# Patient Record
Sex: Male | Born: 1970 | Race: White | Hispanic: No | Marital: Single
Health system: Midwestern US, Academic
[De-identification: ages and names within clinical notes are randomized; demographics above are authoritative.]

## PROBLEM LIST (undated history)

## (undated) ENCOUNTER — Emergency Department (HOSPITAL_COMMUNITY): Admission: EM | Discharge status: Against Medical Advice | Payer: Medicare Other

## (undated) DIAGNOSIS — Z21 Asymptomatic human immunodeficiency virus [HIV] infection status: Secondary | ICD-10-CM

## (undated) DIAGNOSIS — F988 Other specified behavioral and emotional disorders with onset usually occurring in childhood and adolescence: Secondary | ICD-10-CM

## (undated) DIAGNOSIS — F4310 Post-traumatic stress disorder, unspecified: Secondary | ICD-10-CM

## (undated) DIAGNOSIS — B20 Human immunodeficiency virus [HIV] disease: Secondary | ICD-10-CM

## (undated) DIAGNOSIS — F319 Bipolar disorder, unspecified: Secondary | ICD-10-CM

## (undated) HISTORY — PX: HERNIA REPAIR: SHX51

---

## 2013-06-25 ENCOUNTER — Encounter (HOSPITAL_COMMUNITY): Payer: Self-pay | Admitting: Emergency Medicine

## 2013-06-25 ENCOUNTER — Emergency Department (HOSPITAL_COMMUNITY)
Admission: EM | Admit: 2013-06-25 | Discharge: 2013-06-25 | Disposition: A | Discharge status: Home / Self Care | Payer: Medicare (Managed Care) | Attending: Emergency Medicine | Admitting: Emergency Medicine

## 2013-06-25 DIAGNOSIS — F319 Bipolar disorder, unspecified: Secondary | ICD-10-CM | POA: Insufficient documentation

## 2013-06-25 DIAGNOSIS — Z21 Asymptomatic human immunodeficiency virus [HIV] infection status: Secondary | ICD-10-CM | POA: Insufficient documentation

## 2013-06-25 DIAGNOSIS — F17200 Nicotine dependence, unspecified, uncomplicated: Secondary | ICD-10-CM | POA: Insufficient documentation

## 2013-06-25 DIAGNOSIS — F4310 Post-traumatic stress disorder, unspecified: Secondary | ICD-10-CM | POA: Insufficient documentation

## 2013-06-25 DIAGNOSIS — R300 Dysuria: Secondary | ICD-10-CM | POA: Insufficient documentation

## 2013-06-25 DIAGNOSIS — Z760 Encounter for issue of repeat prescription: Secondary | ICD-10-CM | POA: Insufficient documentation

## 2013-06-25 HISTORY — DX: Asymptomatic human immunodeficiency virus (hiv) infection status: Z21

## 2013-06-25 HISTORY — DX: Human immunodeficiency virus (HIV) disease: B20

## 2013-06-25 HISTORY — DX: Other specified behavioral and emotional disorders with onset usually occurring in childhood and adolescence: F98.8

## 2013-06-25 HISTORY — DX: Bipolar disorder, unspecified: F31.9

## 2013-06-25 HISTORY — DX: Post-traumatic stress disorder, unspecified: F43.10

## 2013-06-25 NOTE — ED Provider Notes (Signed)
CSN: [id number]     Arrival date & time 06/25/13  0025 History   First MD Initiated Contact with Patient 06/25/13 0116     Chief Complaint  Patient presents with  . Medication Refill     (Consider location/radiation/quality/duration/timing/severity/associated sxs/prior Treatment) HPI HX per PT - has h/o Bipolar and PTSD presents requesting RX refill for ativan 2.5mg , specifically a 7 day supply.  He is unable to say why he only needs 7 day supply.  He does not have any scheduled follow up. He denies any SI/ HI/ hallucinations.  No CP, SOB, ABD pain, HA, weakness or numbness. He denies any specific complaints, states he is new to the area, no local physician.     Past Medical History  Diagnosis Date  . HIV (human immunodeficiency virus infection)   . ADD (attention deficit disorder)   . Bipolar affective   . PTSD (post-traumatic stress disorder)    Past Surgical History  Procedure Laterality Date  . Hernia repair     Family History  Problem Relation Age of Onset  . Seizures Mother    History  Substance Use Topics  . Smoking status: Current Every Day Smoker -- 0.50 packs/day    Types: Cigarettes  . Smokeless tobacco: Not on file  . Alcohol Use: No    Review of Systems  Constitutional: Negative for fever and chills.  Eyes: Negative for visual disturbance.  Respiratory: Negative for shortness of breath.   Cardiovascular: Negative for chest pain.  Gastrointestinal: Negative for vomiting and abdominal pain.  Genitourinary: Positive for dysuria.  Musculoskeletal: Negative for back pain.  Neurological: Negative for headaches.  Psychiatric/Behavioral: The patient is not nervous/anxious.   All other systems reviewed and are negative.      Allergies  Depakote and Haldol  Home Medications  No current outpatient prescriptions on file. BP 129/79  Pulse 88  Temp(Src) 98.7 F (37.1 C) (Oral)  Resp 16  SpO2 100% Physical Exam  Constitutional: He is oriented to person,  place, and time. He appears well-developed and well-nourished.  HENT:  Head: Normocephalic and atraumatic.  Eyes: EOM are normal. Pupils are equal, round, and reactive to light.  Neck: Neck supple.  Cardiovascular: Regular rhythm and intact distal pulses.   Pulmonary/Chest: Effort normal. No respiratory distress.  Musculoskeletal: Normal range of motion. He exhibits no edema.  Neurological: He is alert and oriented to person, place, and time.  Skin: Skin is warm and dry.  Psychiatric: His behavior is normal.  Somewhat bizarre behavior, no overt psychosis. No hallucinations    ED Course  Procedures (including critical care time) Labs Review Labs Reviewed - No data to display Imaging Review No results found.  Plan d/c home with outpatient resources and referrals. PT stable and appropriate for discharge. No ativan provided.   MDM   Dx: requesting medication refill, h/o Bipolar  No SI/ HI. Psychosis or indication for IVC, PSY admit or further evaluation at this time.  VS and nurses notes reviewed and considered.     Sunnie NielsenBrian Lilyian Quayle, MD 06/25/13 0230

## 2013-06-25 NOTE — ED Notes (Signed)
Pt reports that he had to leave and would come back in the morning. Pt insistent on coming back. Encouraged pt to stay and wait for dr. Pt wanted to leave.

## 2013-06-25 NOTE — ED Notes (Signed)
Pt states he is here for medication refill for Ativan 2.5mg  as needed  Pt is requesting a 7 day supply

## 2013-06-25 NOTE — Discharge Instructions (Signed)
Medication Refill, Emergency Department  We have refilled your medication today as a courtesy to you. It is best for your medical care, however, to take care of getting refills done through your primary caregiver's office. They have your records and can do a better job of follow-up than we can in the emergency department.  On maintenance medications, we often only prescribe enough medications to get you by until you are able to see your regular caregiver. This is a more expensive way to refill medications.  In the future, please plan for refills so that you will not have to use the emergency department for this.  Thank you for your help. Your help allows us to better take care of the daily emergencies that enter our department.  Document Released: 06/28/2003 Document Revised: 06/03/2011 Document Reviewed: 03/11/2005  ExitCare® Patient Information ©2014 [employer].

## 2013-06-26 ENCOUNTER — Encounter (HOSPITAL_COMMUNITY): Payer: Self-pay | Admitting: Emergency Medicine

## 2013-06-26 ENCOUNTER — Emergency Department (HOSPITAL_COMMUNITY)
Admission: EM | Admit: 2013-06-26 | Discharge: 2013-06-29 | Disposition: A | Discharge status: Home / Self Care | Payer: 59 | Attending: Emergency Medicine | Admitting: Emergency Medicine

## 2013-06-26 DIAGNOSIS — F911 Conduct disorder, childhood-onset type: Secondary | ICD-10-CM | POA: Insufficient documentation

## 2013-06-26 DIAGNOSIS — F17200 Nicotine dependence, unspecified, uncomplicated: Secondary | ICD-10-CM | POA: Insufficient documentation

## 2013-06-26 DIAGNOSIS — F29 Unspecified psychosis not due to a substance or known physiological condition: Secondary | ICD-10-CM | POA: Diagnosis present

## 2013-06-26 DIAGNOSIS — Z21 Asymptomatic human immunodeficiency virus [HIV] infection status: Secondary | ICD-10-CM | POA: Insufficient documentation

## 2013-06-26 DIAGNOSIS — F22 Delusional disorders: Secondary | ICD-10-CM | POA: Insufficient documentation

## 2013-06-26 LAB — URINALYSIS, ROUTINE W REFLEX MICROSCOPIC
Bilirubin Urine: NEGATIVE
GLUCOSE, UA: NEGATIVE mg/dL
Hgb urine dipstick: NEGATIVE
KETONES UR: NEGATIVE mg/dL
Leukocytes, UA: NEGATIVE
Nitrite: NEGATIVE
PROTEIN: NEGATIVE mg/dL
Specific Gravity, Urine: 1.006 (ref 1.005–1.030)
Urobilinogen, UA: 0.2 mg/dL (ref 0.0–1.0)
pH: 5.5 (ref 5.0–8.0)

## 2013-06-26 LAB — ACETAMINOPHEN LEVEL: Acetaminophen (Tylenol), Serum: <15 ug/mL (ref 10–30)

## 2013-06-26 LAB — COMPREHENSIVE METABOLIC PANEL
ALBUMIN: 4.2 g/dL (ref 3.5–5.2)
ALK PHOS: 79 U/L (ref 39–117)
ALT: 19 U/L (ref 0–53)
AST: 41 U/L — AB (ref 0–37)
BILIRUBIN TOTAL: 0.9 mg/dL (ref 0.3–1.2)
BUN: 20 mg/dL (ref 6–23)
CHLORIDE: 100 meq/L (ref 96–112)
CO2: 24 mEq/L (ref 19–32)
Calcium: 9.6 mg/dL (ref 8.4–10.5)
Creatinine, Ser: 1.08 mg/dL (ref 0.50–1.35)
GFR calc Af Amer: >90 mL/min (ref >90)
GFR calc non Af Amer: 83 mL/min — ABNORMAL LOW (ref >90)
Glucose, Bld: 110 mg/dL — ABNORMAL HIGH (ref 70–99)
POTASSIUM: 3.8 meq/L (ref 3.7–5.3)
SODIUM: 136 meq/L — AB (ref 137–147)
Total Protein: 8.1 g/dL (ref 6.0–8.3)

## 2013-06-26 LAB — CBC WITH DIFFERENTIAL/PLATELET
Basophils Absolute: 0 10*3/uL (ref 0.0–0.1)
Basophils Relative: 0 % (ref 0–1)
EOS ABS: 0 10*3/uL (ref 0.0–0.7)
Eosinophils Relative: 0 % (ref 0–5)
HCT: 43 % (ref 39.0–52.0)
HEMOGLOBIN: 14.8 g/dL (ref 13.0–17.0)
Lymphocytes Relative: 37 % (ref 12–46)
Lymphs Abs: 2.4 10*3/uL (ref 0.7–4.0)
MCH: 31.2 pg (ref 26.0–34.0)
MCHC: 34.4 g/dL (ref 30.0–36.0)
MCV: 90.7 fL (ref 78.0–100.0)
Monocytes Absolute: 0.7 10*3/uL (ref 0.1–1.0)
Monocytes Relative: 11 % (ref 3–12)
NEUTROS ABS: 3.4 10*3/uL (ref 1.7–7.7)
NEUTROS PCT: 52 % (ref 43–77)
PLATELETS: 155 10*3/uL (ref 150–400)
RBC: 4.74 MIL/uL (ref 4.22–5.81)
RDW: 13.1 % (ref 11.5–15.5)
WBC: 6.4 10*3/uL (ref 4.0–10.5)

## 2013-06-26 LAB — SALICYLATE LEVEL

## 2013-06-26 LAB — RAPID URINE DRUG SCREEN, HOSP PERFORMED
AMPHETAMINES: NOT DETECTED
Barbiturates: NOT DETECTED
Benzodiazepines: NOT DETECTED
Cocaine: NOT DETECTED
OPIATES: NOT DETECTED
Tetrahydrocannabinol: NOT DETECTED

## 2013-06-26 LAB — ETHANOL

## 2013-06-26 MED ORDER — ZIPRASIDONE MESYLATE 20 MG IM SOLR
20.0000 mg | Freq: Once | INTRAMUSCULAR | Status: AC
  Administered 2013-06-26: 20 mg via INTRAMUSCULAR

## 2013-06-26 MED ORDER — ZIPRASIDONE MESYLATE 20 MG IM SOLR
INTRAMUSCULAR | Status: AC
  Filled 2013-06-26: qty 20

## 2013-06-26 MED ORDER — ZOLPIDEM TARTRATE 5 MG PO TABS
5.0000 mg | ORAL_TABLET | Freq: Every evening | ORAL | Status: DC | PRN
  Administered 2013-06-26: 5 mg via ORAL
  Filled 2013-06-26: qty 1

## 2013-06-26 MED ORDER — LORAZEPAM 1 MG PO TABS
1.0000 mg | ORAL_TABLET | Freq: Four times a day (QID) | ORAL | Status: DC | PRN
Start: 2013-06-26 — End: 2013-06-29
  Administered 2013-06-26 – 2013-06-27 (×2): 1 mg via ORAL
  Filled 2013-06-26 (×2): qty 1

## 2013-06-26 MED ORDER — NICOTINE 21 MG/24HR TD PT24
21.0000 mg | MEDICATED_PATCH | Freq: Every day | TRANSDERMAL | Status: DC
  Administered 2013-06-27 – 2013-06-29 (×4): 21 mg via TRANSDERMAL
  Filled 2013-06-26 (×4): qty 1

## 2013-06-26 NOTE — ED Notes (Signed)
Pt eventually returned to his room.

## 2013-06-26 NOTE — ED Notes (Addendum)
On arrival to unit is requesting something to eat. Offered sandwich, soda, graham crackers. Asked for 2 sandwiches, both given. Pleasant and cooperative. States he has not slept in 4 days and would like something to help him sleep.
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
## 2013-06-26 NOTE — ED Notes (Signed)
Pt asleep; no s/s of distress noted. Respirations regular and unlabored.

## 2013-06-26 NOTE — ED Notes (Signed)
Patient awake, agitated and walking to exit doorway down the hallway and appears confused. RN asked pt where he was going, pt began to yell and curse at this RN "Where the hell you going? You don't ask me where I'm going woman! Don't talk to me that way, you don't tell me where the damned bathroom is!" Pt agitated and continues to curse loudly and is angry. [employer] and security in room to redirect behaviors.

## 2013-06-26 NOTE — ED Notes (Signed)
Pt up to window mocking staff and making obscene gestures. 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

## 2013-06-26 NOTE — ED Notes (Signed)
Pt. manipulative and condescending towards staff, insulting them and mocking accents. Pt restless, with loose association, tangential at times. Pt redirected, meds given for agitation and sleep.

## 2013-06-26 NOTE — ED Provider Notes (Signed)
CSN: [id number]     Arrival date & time 06/26/13  1443 History  This chart was scribed for non-physician practitioner, Teressa LowerVrinda Earla Charlie, FNP,working with No att. providers found, by Karle PlumberJennifer Tensley, ED Scribe.  This patient was seen in room WTR2/WLPT2 and the patient's care was started at 3:04 PM.  Chief Complaint  Patient presents with  . Medical Clearance   The history is provided by the patient and the police. No language interpreter was used.   Level 5 Caveat- Full history could not be obtained due to pt being psychotic. HPI Comments:  Adam [name] is a 43 y.o. male with h/o PTSD, HIV, and bipolar disorder [facility], who presents to the Emergency Department secondary to being aggressive towards [place] and security [place] [address].   Past Medical History  Diagnosis Date  . HIV (human immunodeficiency virus infection)   . ADD (attention deficit disorder)   . Bipolar affective   . PTSD (post-traumatic stress disorder)    Past Surgical History  Procedure Laterality Date  . Hernia repair     Family History  Problem Relation Age of Onset  . Seizures Mother    History  Substance Use Topics  . Smoking status: Current Every Day Smoker -- 0.50 packs/day    Types: Cigarettes  . Smokeless tobacco: Not on file  . Alcohol Use: No    Review of Systems  Level 5 Caveat- Full history could not be obtained due to pt being psychotic.  Allergies  Depakote and Haldol  Home Medications  No current outpatient prescriptions on file. Triage Vitals: BP 125/82  Pulse 93  Temp(Src) 98.4 F (36.9 C) (Oral)  Resp 16  Ht 6\' 3"  (1.905 m)  Wt 170 lb (77.111 kg)  BMI 21.25 kg/m2  SpO2 97% Physical Exam  Nursing note and vitals reviewed. Constitutional: He is oriented to person, place, and time. He appears well-developed and well-nourished.  HENT:  Head: Normocephalic and atraumatic.  Eyes: EOM are normal.  Neck: Normal range of motion.  Cardiovascular: Normal rate,  regular rhythm and normal heart sounds.  Exam reveals no gallop and no friction rub.   No murmur heard. Pulmonary/Chest: Effort normal and breath sounds normal. No respiratory distress. He has no wheezes. He has no rales. He exhibits no tenderness.  Musculoskeletal: Normal range of motion.  Neurological: He is alert and oriented to person, place, and time.  Skin: Skin is warm and dry.  Psychiatric: He has a normal mood and affect. His speech is rapid and/or pressured and tangential. He is hyperactive. Thought content is delusional.    ED Course  Procedures (including critical care time) DIAGNOSTIC STUDIES: Oxygen Saturation is 97% on RA, normal by my interpretation.   COORDINATION OF CARE: 3:07 PM- Will order standard medical clearance labs. Pt verbalizes understanding and agrees to plan.  Medications  ziprasidone (GEODON) 20 MG injection (not administered)  ziprasidone (GEODON) injection 20 mg (not administered)    Labs Review Labs Reviewed  CBC WITH DIFFERENTIAL  COMPREHENSIVE METABOLIC PANEL  URINALYSIS, ROUTINE W REFLEX MICROSCOPIC  ETHANOL  ACETAMINOPHEN LEVEL  SALICYLATE LEVEL   Imaging Review No results found.   EKG Interpretation None      MDM   Final diagnoses:  Psychosis    Pt was evaluated by Dr. Fonnie JarvisBednar as well. Pt is to go to pysch ed and be evaluated by tts  I personally performed the services described in this documentation, which was scribed in my presence. The recorded information  has been reviewed and is accurate.    Teressa Lower, NP 06/26/13 1737
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
## 2013-06-26 NOTE — Progress Notes (Signed)
Writer informed the nurse Marcelino DusterMichelle that the patient is under review at the following facilities: [facility], [facility], [address], [facility], [facility], [facility], [facility], [facility], [facility].

## 2013-06-26 NOTE — ED Notes (Signed)
Pt sister contact information: [phone]

## 2013-06-26 NOTE — Progress Notes (Signed)
Adam [name] gave permission to talk with his mother, Adam [name] at [phone].  His sister is also listed:  Adam [name] at [phone] but permission not granted.  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  [place] moved to [place] three weeks ago and [facility].  She would like to be contacted prior to his discharge or transfer.   Nanine MeansJamison Reham Slabaugh, PMH-NP

## 2013-06-26 NOTE — BH Assessment (Signed)
[facility] called to report Pt has been declined due to aggression.  Harlin RainFord Ellis Ria CommentWarrick Jr, LPC, [facility] Triage Specialist [phone]

## 2013-06-26 NOTE — Progress Notes (Signed)
Pt's referral was faxed to the following facilities with bed availability for inptx: [facility]- per [facility] beds available [facility]- per [facility] beds available [facility]- per [facility] beds available [facility]- [facility]- per Tom beds available [facility]- per [facility] beds available [facility]- per Trey [facility] beds available [facility]- per Christiane Ha[facility] beds available [facility]- per [facility] available  The following facilities contacted are at capacity: [facility]- per [facility]- per [facility]- per Dorathy DaftKayla on gero beds available [facility]- per [facility] -per [facility] She[facility] possible gero only [facility]- per [facility]- per [facility]- per [facility], [place] stated Dr. [facility] not taking anymore outside referrals until tomorrow (Sun 4.5) after 11am [facility]- per Tacey RuizLeah at capacity on all levels except for child male bed    [facility] Britnee Mcdevitt Disposition MHT
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
## 2013-06-26 NOTE — Progress Notes (Signed)
[facility] in [facility] as well as if any PRN medications had been given.  Information was looked up and given.  In addition wanted information on wether pt would be IVC'd or not, informed them that EDP was in the process of doing IVC paper work at this time.   Tomi BambergerMariya Lundy Cozart Disposition MHT

## 2013-06-26 NOTE — ED Notes (Signed)
Per [employer], Patient gave them phone number of sister. Bernell ListSonya Burgess [phone]

## 2013-06-26 NOTE — ED Notes (Signed)
Up to restroom.

## 2013-06-26 NOTE — Consult Note (Signed)
[facility] Face-to-Face Psychiatry Consult   Reason for Consult:  Psychosis Referring Physician:  ED MD Johnel Yielding is an 43 y.o. male. Total Time spent with patient: 20 minutes  Assessment: AXIS I:  Psychotic Disorder NOS AXIS II:  Deferred AXIS III:   Past Medical History  Diagnosis Date  . HIV (human immunodeficiency virus infection)   . ADD (attention deficit disorder)   . Bipolar affective   . PTSD (post-traumatic stress disorder)    AXIS IV:  economic problems, housing problems, other psychosocial or environmental problems, problems related to social environment and problems with primary support group AXIS V:  21-30 behavior considerably influenced by delusions or hallucinations OR serious impairment in judgment, communication OR inability to function in almost all areas  Plan:  Recommend psychiatric Inpatient admission when medically cleared.  Subjective:   Zacory Fiola is a 43 y.o. male patient admitted with psychosis.  HPI:   Patient came to the ED yesterday requesting Ativan but was denied.  Today, the police picked him up at the mall when he was causing a disturbance.  [place].  Dishelved, malodorous, guarded, paranoid, forwards little to no information, "It is none of your business."    HPI Elements:   Location:  generalized. Quality:  acute. Severity:  severe. Timing:  constant. Duration:  few days. Context:  stressors.  Past Psychiatric History: Past Medical History  Diagnosis Date  . HIV (human immunodeficiency virus infection)   . ADD (attention deficit disorder)   . Bipolar affective   . PTSD (post-traumatic stress disorder)     reports that he has been smoking Cigarettes.  He has been smoking about 0.50 packs per day. He does not have any smokeless tobacco history on file. He reports that he does not drink alcohol or use illicit drugs. Family History  Problem Relation Age of Onset  . Seizures Mother            Allergies:    Allergies  Allergen Reactions  . Depakote [Divalproex Sodium] Anaphylaxis and Other (See Comments)    Pt states one of these meds attacked his white blood cells  . Haldol [Haloperidol Lactate] Anaphylaxis and Other (See Comments)    Pt states one of these meds attacked his white blood cells    ACT Assessment Complete:  Yes:    Educational Status    Risk to Self: Risk to self Is patient at risk for suicide?: No Substance abuse history and/or treatment for substance abuse?: No  Risk to Others:    Abuse:    Prior Inpatient Therapy:    Prior Outpatient Therapy:    Additional Information:                    Objective: Blood pressure 125/82, pulse 93, temperature 98.4 F (36.9 C), temperature source Oral, resp. rate 16, height _0  (1.905 m), weight 170 lb (77.111 kg), SpO2 97.00%.Body mass index is 21.25 kg/(m^2). Results for orders placed during the hospital encounter of 06/26/13 (from the past 72 hour(s))  CBC WITH DIFFERENTIAL     Status: None   Collection Time    06/26/13  3:40 PM      Result Value Ref Range   WBC 6.4  4.0 - 10.5 K/uL   RBC 4.74  4.22 - 5.81 MIL/uL   Hemoglobin 14.8  13.0 - 17.0 g/dL   HCT 43.0  39.0 - 52.0 %   MCV 90.7  78.0 - 100.0 fL  MCH 31.2  26.0 - 34.0 pg   MCHC 34.4  30.0 - 36.0 g/dL   RDW 13.1  11.5 - 15.5 %   Platelets 155  150 - 400 K/uL   Neutrophils Relative % 52  43 - 77 %   Neutro Abs 3.4  1.7 - 7.7 K/uL   Lymphocytes Relative 37  12 - 46 %   Lymphs Abs 2.4  0.7 - 4.0 K/uL   Monocytes Relative 11  3 - 12 %   Monocytes Absolute 0.7  0.1 - 1.0 K/uL   Eosinophils Relative 0  0 - 5 %   Eosinophils Absolute 0.0  0.0 - 0.7 K/uL   Basophils Relative 0  0 - 1 %   Basophils Absolute 0.0  0.0 - 0.1 K/uL  COMPREHENSIVE METABOLIC PANEL     Status: Abnormal   Collection Time    06/26/13  3:40 PM      Result Value Ref Range   Sodium 136 (*) 137 - 147 mEq/L   Potassium 3.8  3.7 - 5.3 mEq/L   Chloride 100  96 - 112 mEq/L   CO2 24   19 - 32 mEq/L   Glucose, Bld 110 (*) 70 - 99 mg/dL   BUN 20  6 - 23 mg/dL   Creatinine, Ser 1.08  0.50 - 1.35 mg/dL   Calcium 9.6  8.4 - 10.5 mg/dL   Total Protein 8.1  6.0 - 8.3 g/dL   Albumin 4.2  3.5 - 5.2 g/dL   AST 41 (*) 0 - 37 U/L   ALT 19  0 - 53 U/L   Alkaline Phosphatase 79  39 - 117 U/L   Total Bilirubin 0.9  0.3 - 1.2 mg/dL   GFR calc non Af Amer 83 (*) >90 mL/min   GFR calc Af Amer >90  >90 mL/min   Comment: (NOTE)     The eGFR has been calculated using the CKD EPI equation.     This calculation has not been validated in all clinical situations.     eGFR's persistently <90 mL/min signify possible Chronic Kidney     Disease.  ETHANOL     Status: None   Collection Time    06/26/13  3:40 PM      Result Value Ref Range   Alcohol, Ethyl (B) <11  0 - 11 mg/dL   Comment:            LOWEST DETECTABLE LIMIT FOR     SERUM ALCOHOL IS 11 mg/dL     FOR MEDICAL PURPOSES ONLY  ACETAMINOPHEN LEVEL     Status: None   Collection Time    06/26/13  3:40 PM      Result Value Ref Range   Acetaminophen (Tylenol), Serum <15.0  10 - 30 ug/mL   Comment:            THERAPEUTIC CONCENTRATIONS VARY     SIGNIFICANTLY. A RANGE OF 10-30     ug/mL MAY BE AN EFFECTIVE     CONCENTRATION FOR MANY PATIENTS.     HOWEVER, SOME ARE BEST TREATED     AT CONCENTRATIONS OUTSIDE THIS     RANGE.     ACETAMINOPHEN CONCENTRATIONS     >150 ug/mL AT 4 HOURS AFTER     INGESTION AND >50 ug/mL AT 12     HOURS AFTER INGESTION ARE     OFTEN ASSOCIATED WITH TOXIC     REACTIONS.  SALICYLATE LEVEL  Status: Abnormal   Collection Time    06/26/13  3:40 PM      Result Value Ref Range   Salicylate Lvl <3.8 (*) 2.8 - 20.0 mg/dL   Labs are reviewed and are pertinent for medical issues being treated.  Current Facility-Administered Medications  Medication Dose Route Frequency Provider Last Rate Last Dose  . nicotine (NICODERM CQ - dosed in mg/24 hours) patch 21 mg  21 mg Transdermal Daily Babette Relic, MD        No current outpatient prescriptions on file.    Psychiatric Specialty Exam:     Blood pressure 125/82, pulse 93, temperature 98.4 F (36.9 C), temperature source Oral, resp. rate 16, height _0  (1.905 m), weight 170 lb (77.111 kg), SpO2 97.00%.Body mass index is 21.25 kg/(m^2).  General Appearance: Disheveled  Eye Contact::  Minimal  Speech:  Normal Rate  Volume:  Normal  Mood:  Angry and Irritable  Affect:  Flat  Thought Process:  Irrelevant  Orientation:  Full (Time, Place, and Person)  Thought Content:  Hallucinations: Auditory Visual and Paranoid Ideation  Suicidal Thoughts:  No  Homicidal Thoughts:  No  Memory:  Immediate;   Fair Recent;   Fair Remote;   Poor  Judgement:  Impaired  Insight:  Lacking  Psychomotor Activity:  Normal  Concentration:  Fair  Recall:  Poor  Fund of Knowledge:Fair  Language: Good  Akathisia:  No  Handed:  Right  AIMS (if indicated):     Assets:  Resilience  Sleep:      Musculoskeletal: Strength & Muscle Tone: within normal limits Gait & Station: normal Patient leans: N/A  Treatment Plan Summary: Antipsychotic medications and awaiting labs.  Waylan Boga, [place] 06/26/2013 4:35 PM  Reviewed the information documented and agree with the treatment plan.  Anyelo Mccue,JANARDHAHA R. 06/27/2013 9:34 AM

## 2013-06-27 ENCOUNTER — Encounter (HOSPITAL_COMMUNITY): Payer: Self-pay | Admitting: Registered Nurse

## 2013-06-27 MED ORDER — DIPHENHYDRAMINE HCL 25 MG PO CAPS
50.0000 mg | ORAL_CAPSULE | Freq: Two times a day (BID) | ORAL | Status: DC
  Administered 2013-06-27 – 2013-06-29 (×4): 50 mg via ORAL
  Filled 2013-06-27 (×4): qty 2

## 2013-06-27 MED ORDER — LAMOTRIGINE 100 MG PO TABS
100.0000 mg | ORAL_TABLET | Freq: Two times a day (BID) | ORAL | Status: DC
  Administered 2013-06-27 – 2013-06-29 (×4): 100 mg via ORAL
  Filled 2013-06-27 (×6): qty 1

## 2013-06-27 MED ORDER — DIPHENHYDRAMINE HCL 50 MG/ML IJ SOLN
50.0000 mg | Freq: Once | INTRAMUSCULAR | Status: AC
  Administered 2013-06-27: 50 mg via INTRAMUSCULAR

## 2013-06-27 MED ORDER — LIDOCAINE HCL (CARDIAC) 20 MG/ML IV SOLN
INTRAVENOUS | Status: AC
  Filled 2013-06-27: qty 5

## 2013-06-27 MED ORDER — DIPHENHYDRAMINE HCL 50 MG/ML IJ SOLN
INTRAMUSCULAR | Status: AC
  Administered 2013-06-27: 50 mg via INTRAMUSCULAR
  Filled 2013-06-27: qty 1

## 2013-06-27 MED ORDER — ZIPRASIDONE HCL 20 MG PO CAPS
40.0000 mg | ORAL_CAPSULE | Freq: Two times a day (BID) | ORAL | Status: DC
  Administered 2013-06-27 – 2013-06-28 (×3): 40 mg via ORAL
  Filled 2013-06-27 (×3): qty 2

## 2013-06-27 MED ORDER — ZIPRASIDONE MESYLATE 20 MG IM SOLR
20.0000 mg | Freq: Once | INTRAMUSCULAR | Status: AC
  Administered 2013-06-27: 20 mg via INTRAMUSCULAR

## 2013-06-27 MED ORDER — SUCCINYLCHOLINE CHLORIDE 20 MG/ML IJ SOLN
INTRAMUSCULAR | Status: AC
  Filled 2013-06-27: qty 1

## 2013-06-27 MED ORDER — LORAZEPAM 1 MG PO TABS
2.0000 mg | ORAL_TABLET | Freq: Two times a day (BID) | ORAL | Status: DC
  Administered 2013-06-27 – 2013-06-29 (×4): 2 mg via ORAL
  Filled 2013-06-27 (×4): qty 2

## 2013-06-27 MED ORDER — LORAZEPAM 2 MG/ML IJ SOLN
2.0000 mg | Freq: Once | INTRAMUSCULAR | Status: AC
  Administered 2013-06-27: 2 mg via INTRAMUSCULAR

## 2013-06-27 MED ORDER — ZIPRASIDONE MESYLATE 20 MG IM SOLR: 20.0000 mg | Freq: Once | INTRAMUSCULAR | Status: DC

## 2013-06-27 MED ORDER — LORAZEPAM 2 MG/ML IJ SOLN
INTRAMUSCULAR | Status: AC
  Administered 2013-06-27: 2 mg via INTRAMUSCULAR
  Filled 2013-06-27: qty 1

## 2013-06-27 MED ORDER — ROCURONIUM BROMIDE 50 MG/5ML IV SOLN
INTRAVENOUS | Status: AC
  Filled 2013-06-27: qty 2

## 2013-06-27 MED ORDER — ETOMIDATE 2 MG/ML IV SOLN
INTRAVENOUS | Status: AC
  Filled 2013-06-27: qty 20

## 2013-06-27 MED ORDER — ZIPRASIDONE MESYLATE 20 MG IM SOLR
INTRAMUSCULAR | Status: AC
Start: 2013-06-27 — End: 2013-06-27
  Administered 2013-06-27: 20 mg via INTRAMUSCULAR
  Filled 2013-06-27: qty 20
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
## 2013-06-27 NOTE — ED Notes (Signed)
Pt up out of bed, urinating on the floor as he walked to the bathroom. Pt unsteady and needing one person assist.

## 2013-06-27 NOTE — Progress Notes (Signed)
[facility], states pt has been declined d/t aggressive behavior.   Tomi BambergerMariya Dayvian Blixt Disposition MHT

## 2013-06-27 NOTE — Consult Note (Signed)
[facility] Follow UP Psychiatry Consult   Reason for Consult:  Psychosis Referring Physician:  ED MD Adam [name] is an 43 y.o. male. Total Time spent with patient: 20 minutes  Assessment: AXIS I:  Psychotic Disorder NOS AXIS II:  Deferred AXIS III:   Past Medical History  Diagnosis Date  . HIV (human immunodeficiency virus infection)   . ADD (attention deficit disorder)   . Bipolar affective   . PTSD (post-traumatic stress disorder)    AXIS IV:  economic problems, housing problems, other psychosocial or environmental problems, problems related to social environment and problems with primary support group AXIS V:  21-30 behavior considerably influenced by delusions or hallucinations OR serious impairment in judgment, communication OR inability to function in almost all areas  Plan:  Recommend psychiatric Inpatient admission when medically cleared.  Subjective:   Adam [name] is a 43 y.o. male patient admitted with psychosis.  HPI:   Patient states that he is on vacation. "I'm running out of time; I've got a plan to catch in 3 days.  Look all you need to do is Listen, write and repeat."  Patient couldn't give a detail history of psych history.  States that he does not like and will not take Lithium, Depakote, Risperdal Consta."  Patient states that he is from [facility] and is here just visiting. Patient also reported he was previously treated with Geodon, risperidone, and Lamictal and Ativan. Patient stated he does not like needles and denied using drugs. Patient has odd, bizarre behaviors, manic and grandiose delusions and disorganized thoughts.  HPI Elements:   Location:  generalized. Quality:  acute. Severity:  severe. Timing:  constant. Duration:  few days. Context:  stressors. Review of Systems  Musculoskeletal: Negative.   Psychiatric/Behavioral: Positive for hallucinations. Negative for depression and suicidal ideas. The patient is nervous/anxious.     Past Psychiatric  History: Past Medical History  Diagnosis Date  . HIV (human immunodeficiency virus infection)   . ADD (attention deficit disorder)   . Bipolar affective   . PTSD (post-traumatic stress disorder)     reports that he has been smoking Cigarettes.  He has been smoking about 0.50 packs per day. He does not have any smokeless tobacco history on file. He reports that he does not drink alcohol or use illicit drugs. Family History  Problem Relation Age of Onset  . Seizures Mother            Allergies:   Allergies  Allergen Reactions  . Depakote [Divalproex Sodium] Anaphylaxis and Other (See Comments)    Pt states one of these meds attacked his white blood cells  . Haldol [Haloperidol Lactate] Anaphylaxis and Other (See Comments)    Pt states one of these meds attacked his white blood cells    ACT Assessment Complete:  Yes:    Educational Status    Risk to Self: Risk to self Is patient at risk for suicide?: No Substance abuse history and/or treatment for substance abuse?: No  Risk to Others:    Abuse:    Prior Inpatient Therapy:    Prior Outpatient Therapy:    Additional Information:     Objective: Blood pressure 130/90, pulse 88, temperature 97.5 F (36.4 C), temperature source Oral, resp. rate 20, height 6' 3"  (1.905 m), weight 77.111 kg (170 lb), SpO2 97.00%.Body mass index is 21.25 kg/(m^2). Results for orders placed during the hospital encounter of 06/26/13 (from the past 72 hour(s))  CBC WITH DIFFERENTIAL     Status:  None   Collection Time    06/26/13  3:40 PM      Result Value Ref Range   WBC 6.4  4.0 - 10.5 K/uL   RBC 4.74  4.22 - 5.81 MIL/uL   Hemoglobin 14.8  13.0 - 17.0 g/dL   HCT 43.0  39.0 - 52.0 %   MCV 90.7  78.0 - 100.0 fL   MCH 31.2  26.0 - 34.0 pg   MCHC 34.4  30.0 - 36.0 g/dL   RDW 13.1  11.5 - 15.5 %   Platelets 155  150 - 400 K/uL   Neutrophils Relative % 52  43 - 77 %   Neutro Abs 3.4  1.7 - 7.7 K/uL   Lymphocytes Relative 37  12 - 46 %   Lymphs  Abs 2.4  0.7 - 4.0 K/uL   Monocytes Relative 11  3 - 12 %   Monocytes Absolute 0.7  0.1 - 1.0 K/uL   Eosinophils Relative 0  0 - 5 %   Eosinophils Absolute 0.0  0.0 - 0.7 K/uL   Basophils Relative 0  0 - 1 %   Basophils Absolute 0.0  0.0 - 0.1 K/uL  COMPREHENSIVE METABOLIC PANEL     Status: Abnormal   Collection Time    06/26/13  3:40 PM      Result Value Ref Range   Sodium 136 (*) 137 - 147 mEq/L   Potassium 3.8  3.7 - 5.3 mEq/L   Chloride 100  96 - 112 mEq/L   CO2 24  19 - 32 mEq/L   Glucose, Bld 110 (*) 70 - 99 mg/dL   BUN 20  6 - 23 mg/dL   Creatinine, Ser 1.08  0.50 - 1.35 mg/dL   Calcium 9.6  8.4 - 10.5 mg/dL   Total Protein 8.1  6.0 - 8.3 g/dL   Albumin 4.2  3.5 - 5.2 g/dL   AST 41 (*) 0 - 37 U/L   ALT 19  0 - 53 U/L   Alkaline Phosphatase 79  39 - 117 U/L   Total Bilirubin 0.9  0.3 - 1.2 mg/dL   GFR calc non Af Amer 83 (*) >90 mL/min   GFR calc Af Amer >90  >90 mL/min   Comment: (NOTE)     The eGFR has been calculated using the CKD EPI equation.     This calculation has not been validated in all clinical situations.     eGFR's persistently <90 mL/min signify possible Chronic Kidney     Disease.  ETHANOL     Status: None   Collection Time    06/26/13  3:40 PM      Result Value Ref Range   Alcohol, Ethyl (B) <11  0 - 11 mg/dL   Comment:            LOWEST DETECTABLE LIMIT FOR     SERUM ALCOHOL IS 11 mg/dL     FOR MEDICAL PURPOSES ONLY  ACETAMINOPHEN LEVEL     Status: None   Collection Time    06/26/13  3:40 PM      Result Value Ref Range   Acetaminophen (Tylenol), Serum <15.0  10 - 30 ug/mL   Comment:            THERAPEUTIC CONCENTRATIONS VARY     SIGNIFICANTLY. A RANGE OF 10-30     ug/mL MAY BE AN EFFECTIVE     CONCENTRATION FOR MANY PATIENTS.     HOWEVER, SOME ARE BEST  TREATED     AT CONCENTRATIONS OUTSIDE THIS     RANGE.     ACETAMINOPHEN CONCENTRATIONS     >150 ug/mL AT 4 HOURS AFTER     INGESTION AND >50 ug/mL AT 12     HOURS AFTER INGESTION ARE      OFTEN ASSOCIATED WITH TOXIC     REACTIONS.  SALICYLATE LEVEL     Status: Abnormal   Collection Time    06/26/13  3:40 PM      Result Value Ref Range   Salicylate Lvl <1.9 (*) 2.8 - 20.0 mg/dL  URINALYSIS, ROUTINE W REFLEX MICROSCOPIC     Status: None   Collection Time    06/26/13  9:19 PM      Result Value Ref Range   Color, Urine YELLOW  YELLOW   APPearance CLEAR  CLEAR   Specific Gravity, Urine 1.006  1.005 - 1.030   pH 5.5  5.0 - 8.0   Glucose, UA NEGATIVE  NEGATIVE mg/dL   Hgb urine dipstick NEGATIVE  NEGATIVE   Bilirubin Urine NEGATIVE  NEGATIVE   Ketones, ur NEGATIVE  NEGATIVE mg/dL   Protein, ur NEGATIVE  NEGATIVE mg/dL   Urobilinogen, UA 0.2  0.0 - 1.0 mg/dL   Nitrite NEGATIVE  NEGATIVE   Leukocytes, UA NEGATIVE  NEGATIVE   Comment: MICROSCOPIC NOT DONE ON URINES WITH NEGATIVE PROTEIN, BLOOD, LEUKOCYTES, NITRITE, OR GLUCOSE <1000 mg/dL.  URINE RAPID DRUG SCREEN (HOSP PERFORMED)     Status: None   Collection Time    06/26/13  9:19 PM      Result Value Ref Range   Opiates NONE DETECTED  NONE DETECTED   Cocaine NONE DETECTED  NONE DETECTED   Benzodiazepines NONE DETECTED  NONE DETECTED   Amphetamines NONE DETECTED  NONE DETECTED   Tetrahydrocannabinol NONE DETECTED  NONE DETECTED   Barbiturates NONE DETECTED  NONE DETECTED   Comment:            DRUG SCREEN FOR MEDICAL PURPOSES     ONLY.  IF CONFIRMATION IS NEEDED     FOR ANY PURPOSE, NOTIFY LAB     WITHIN 5 DAYS.                LOWEST DETECTABLE LIMITS     FOR URINE DRUG SCREEN     Drug Class       Cutoff (ng/mL)     Amphetamine      1000     Barbiturate      200     Benzodiazepine   622     Tricyclics       297     Opiates          300     Cocaine          300     THC              50   Labs are reviewed and are pertinent for medical issues being treated.  Current Facility-Administered Medications  Medication Dose Route Frequency Provider Last Rate Last Dose  . diphenhydrAMINE (BENADRYL) capsule 50 mg  50  mg Oral BID Durward Parcel, MD   50 mg at 06/27/13 1215  . lamoTRIgine (LAMICTAL) tablet 100 mg  100 mg Oral BID Durward Parcel, MD   100 mg at 06/27/13 1223  . LORazepam (ATIVAN) tablet 1 mg  1 mg Oral Q6H PRN Waylan Boga, NP   1 mg at 06/27/13 1022  .  LORazepam (ATIVAN) tablet 2 mg  2 mg Oral BID Durward Parcel, MD   2 mg at 06/27/13 1215  . nicotine (NICODERM CQ - dosed in mg/24 hours) patch 21 mg  21 mg Transdermal Daily Babette Relic, MD   21 mg at 06/27/13 1021  . ziprasidone (GEODON) capsule 40 mg  40 mg Oral BID WC Durward Parcel, MD   40 mg at 06/27/13 1215  . zolpidem (AMBIEN) tablet 5 mg  5 mg Oral QHS PRN Waylan Boga, NP   5 mg at 06/26/13 2205   No current outpatient prescriptions on file.    Psychiatric Specialty Exam:     Blood pressure 130/90, pulse 88, temperature 97.5 F (36.4 C), temperature source Oral, resp. rate 20, height 6' 3"  (1.905 m), weight 77.111 kg (170 lb), SpO2 97.00%.Body mass index is 21.25 kg/(m^2).  General Appearance: Disheveled  Eye Contact::  Good  Speech:  Normal Rate  Volume:  Normal  Mood:  Angry and Irritable  Affect:  Flat  Thought Process:  Irrelevant  Orientation:  Full (Time, Place, and Person)  Thought Content:  Hallucinations: Auditory Visual and Paranoid Ideation  Suicidal Thoughts:  No  Homicidal Thoughts:  No  Memory:  Immediate;   Fair Recent;   Fair Remote;   Poor  Judgement:  Impaired  Insight:  Lacking  Psychomotor Activity:  Normal  Concentration:  Fair  Recall:  Poor  Fund of Knowledge:Fair  Language: Good  Akathisia:  No  Handed:  Right  AIMS (if indicated):     Assets:  Resilience  Sleep:      Musculoskeletal: Strength & Muscle Tone: within normal limits Gait & Station: normal Patient leans: N/A  Treatment Plan Summary:  Recommended acute Inpatient psychiatric treatment recommended.   Monitor patient for safety and stabilization until inpatient treatment bed is  located.   Started Lamictal 100 mg BID, Geodon 40 mg Bid, Ativan 2 mg Bid, and Benadryl 50 mg BID all by mouth.      Adam Newport, FNP [facility] 06/27/2013 2:08 PM  Patient was seen face-to-face for psychiatric evaluation and examination, suicide risk assessment case discussed with treatment team, physician extender and made appropriate disposition plan. Reviewed the information documented and agree with the treatment plan.   Adam [name],JANARDHAHA R. 06/27/2013 3:29 PM

## 2013-06-27 NOTE — ED Notes (Signed)
Patient approaching desk, screaming and demanding to see an MD to go home immediately. RN informed pt that MD will see him in the am during rounds. Pt became irate and began punching glass at nursing station, took both cups filled with crayons off of the desk and threw them at the window and all over the unit floors. Pt continued to curse and threaten staff; [employer] and security called to unit for assistance. Drenda FreezeFran, NP called for orders; new orders received.

## 2013-06-27 NOTE — ED Notes (Signed)
Patient approaching nursing station intentionally attempting to instigate arguments with staff. Staff did not engage in debates, but rather attempted to redirect behaviors, without success. Pt did, however return to his room after a few minutes, mumbling and cursing to himself.

## 2013-06-27 NOTE — ED Notes (Signed)
Up to restroom several times and then back to sleep. Calm, pleasant, easy to redirect.

## 2013-06-28 DIAGNOSIS — F29 Unspecified psychosis not due to a substance or known physiological condition: Secondary | ICD-10-CM

## 2013-06-28 MED ORDER — RISPERIDONE 2 MG PO TBDP
2.0000 mg | ORAL_TABLET | Freq: Every day | ORAL | Status: DC | PRN
  Filled 2013-06-28: qty 1

## 2013-06-28 MED ORDER — ZIPRASIDONE HCL 20 MG PO CAPS
80.0000 mg | ORAL_CAPSULE | Freq: Two times a day (BID) | ORAL | Status: DC
  Administered 2013-06-28 – 2013-06-29 (×2): 80 mg via ORAL
  Filled 2013-06-28 (×2): qty 4

## 2013-06-28 MED ORDER — RISPERIDONE 2 MG PO TBDP
2.0000 mg | ORAL_TABLET | ORAL | Status: DC
  Filled 2013-06-28: qty 1

## 2013-06-28 NOTE — ED Notes (Signed)
When i went to get patient vitals, he refused vitals and said for me to tell the rn mercy de doesn't want his night meds either

## 2013-06-28 NOTE — Progress Notes (Signed)
   CARE MANAGEMENT ED NOTE 06/28/2013  Patient:  Adam [name] [name],Adam [name]   Account Number:  [id number]  Date Initiated:  06/28/2013  Documentation initiated by:  Edd ArbourGIBBS,KIMBERLY  Subjective/Objective Assessment:   43 yr old medicare dx psychotic disorder PMH HIV, ADD, bipolar PTSD  2 ED visits in last 6 months no admissions noted UDS negative     Subjective/Objective Assessment Detail:   Cm noted pt without pcp listed  Pt asleep when CM went to see EPIC notes indicated pt had issues with insomanic Cm did not awake pt  BH MD 06/28/13 note indicates pt remains paranoid, intrusive and unpredictable with noted hallucinations     Action/Plan:   Cm went to speak with pt but postponed   Action/Plan Detail:   Anticipated DC Date:       Status Recommendation to Physician:   Result of Recommendation:    Other ED Services  Consult Working Plan    DC Planning Services  Other  PCP issues  Outpatient Services - Pt will follow up    Choice offered to / List presented to:            Status of service:  Completed, signed off  ED Comments:   ED Comments Detail:

## 2013-06-28 NOTE — ED Notes (Signed)
Patient agitated. States that TTS did not come to his room after asking two people to see him. Asked to leave unit to smoke. Asked about seeing hospital lawyers. Patient reminded of the time. Reminded that rounds would be made later in the morning. Patient asked to go to him room. Patient restless, agitated, verbally aggressive, demanding.  Encouragement offered.  Patient safety maintained. Q 15 checks continue.

## 2013-06-28 NOTE — ED Notes (Signed)
Patient denies pain and is resting comfortably.  

## 2013-06-28 NOTE — BHH Counselor (Addendum)
[facility]. Atilano MedianGale Carson [facility] - she doesn't have referral at the moment so clinical team must be reviewing. [facility] - left voicemail. [facility] - [facility] so didn't receive referral. Writer faxed referral.     Evette Cristalaroline Paige Dionicia Cerritos, LCSWA Assessment Counselor

## 2013-06-28 NOTE — Progress Notes (Signed)
[facility] and [facility].  WUJW#[id number]#[id number]. CSW completed over the phone referral with Roseanne RenoStewart with [facility].  The CSW or TTS will follow up in the AM to confirm his status on the wait list.      Maryelizabeth Rowanressa Erisa Mehlman, MSW, LCSWA, 06/28/2013 Evening Clinical Social Worker [phone]

## 2013-06-28 NOTE — BH Assessment (Signed)
Contacted the following facilities for placement:  [facility]: Pt is under review [facility]: Pt is under review [facility]: [facility] is under [facility]: [facility]: Pt is under review  [facility]: Beds available. Faxed clinical information   [facility]: At capacity [facility]: At capacity per [facility]  [facility]: At capacity per [facility]  [facility]: At capacity per [facility] Shock[facility]ristine  [facility]: At capacity per [facility]  [facility]: At capacity per Murlean Harkavid  [facility]: At capacity per [facility]  [facility]: At capacity per Odette FractionKathy  [place]: At capacity per [facility]  [place]: At capacity per Kirtland BouchardJennifer  [facility]: At capacity per [facility]  [facility]: At capacity per [facility]  [facility]: At capacity per [facility]  [place]: At capacity per [facility]   [facility]: Pt declined  [facility]: Pt declined due to acuity  [facility]: Pt is declined [facility]: Pt is declined   Harlin RainFord Ellis Patsy BaltimoreWarrick Jr, [facility], [facility] Triage Specialist [phone]

## 2013-06-28 NOTE — ED Notes (Signed)
Patient in bed resting at the beginning of this shift. Mood and affect irritable and somewhat angry. Responded to questions and assessment with yes/no. Writer encouraged patient to notified  Staff if he needs anything. Patient receptive to encouragement and support. Q 15 minute check continues as ordered to maintain safety.

## 2013-06-28 NOTE — ED Notes (Signed)
Patient observed in bed resting with even, unlabored respirations. Up to bathroom. Given Sprite. Patient safety maintained, Q 15 checks continue.

## 2013-06-28 NOTE — Consult Note (Signed)
[facility] Follow UP Psychiatry Consult   Reason for Consult:  Psychosis Referring Physician:  ED MD Adam [name] is an 43 y.o. male. Total Time spent with patient: 20 minutes  Assessment: AXIS I:  Psychotic Disorder NOS AXIS II:  Deferred AXIS III:   Past Medical History  Diagnosis Date  . HIV (human immunodeficiency virus infection)   . ADD (attention deficit disorder)   . Bipolar affective   . PTSD (post-traumatic stress disorder)    AXIS IV:  economic problems, housing problems, other psychosocial or environmental problems, problems related to social environment and problems with primary support group AXIS V:  21-30 behavior considerably influenced by delusions or hallucinations OR serious impairment in judgment, communication OR inability to function in almost all areas  Plan:  Recommend psychiatric Inpatient admission when medically cleared.  Subjective:   Adam [name] is a 43 y.o. male patient admitted with psychosis.  HPI:   Patient states that he is on vacation. "I'm running out of time; I've got a plan to catch in 3 days.  Look all you need to do is Listen, write and repeat."  Patient couldn't give a detail history of psych history.  States that he does not like and will not take Lithium, Depakote, Risperdal Consta."  Patient states that he is from [facility] and is here just visiting. Patient also reported he was previously treated with Geodon, risperidone, and Lamictal and Ativan. Patient stated he does not like needles and denied using drugs. Patient has odd, bizarre behaviors, manic and grandiose delusions and disorganized thoughts.  Today continues to remain paranoid, intrusive and unpredictable.  HPI Elements:   Location:  generalized. Quality:  acute. Severity:  severe. Timing:  constant. Duration:  few days. Context:  stressors. Review of Systems  Musculoskeletal: Negative.   Psychiatric/Behavioral: Positive for hallucinations. Negative for depression and  suicidal ideas. The patient is nervous/anxious.     Past Psychiatric History: Past Medical History  Diagnosis Date  . HIV (human immunodeficiency virus infection)   . ADD (attention deficit disorder)   . Bipolar affective   . PTSD (post-traumatic stress disorder)     reports that he has been smoking Cigarettes.  He has been smoking about 0.50 packs per day. He does not have any smokeless tobacco history on file. He reports that he does not drink alcohol or use illicit drugs. Family History  Problem Relation Age of Onset  . Seizures Mother            Allergies:   Allergies  Allergen Reactions  . Depakote [Divalproex Sodium] Anaphylaxis and Other (See Comments)    Pt states one of these meds attacked his white blood cells  . Haldol [Haloperidol Lactate] Anaphylaxis and Other (See Comments)    Pt states one of these meds attacked his white blood cells    ACT Assessment Complete:  Yes:    Educational Status    Risk to Self: Risk to self Is patient at risk for suicide?: No Substance abuse history and/or treatment for substance abuse?: No  Risk to Others:    Abuse:    Prior Inpatient Therapy:    Prior Outpatient Therapy:    Additional Information:     Objective: Blood pressure 122/82, pulse 94, temperature 97.5 F (36.4 C), temperature source Oral, resp. rate 18, height 6' 3"  (1.905 m), weight 77.111 kg (170 lb), SpO2 100.00%.Body mass index is 21.25 kg/(m^2). Results for orders placed during the hospital encounter of 06/26/13 (from the past 72  hour(s))  CBC WITH DIFFERENTIAL     Status: None   Collection Time    06/26/13  3:40 PM      Result Value Ref Range   WBC 6.4  4.0 - 10.5 K/uL   RBC 4.74  4.22 - 5.81 MIL/uL   Hemoglobin 14.8  13.0 - 17.0 g/dL   HCT 43.0  39.0 - 52.0 %   MCV 90.7  78.0 - 100.0 fL   MCH 31.2  26.0 - 34.0 pg   MCHC 34.4  30.0 - 36.0 g/dL   RDW 13.1  11.5 - 15.5 %   Platelets 155  150 - 400 K/uL   Neutrophils Relative % 52  43 - 77 %   Neutro  Abs 3.4  1.7 - 7.7 K/uL   Lymphocytes Relative 37  12 - 46 %   Lymphs Abs 2.4  0.7 - 4.0 K/uL   Monocytes Relative 11  3 - 12 %   Monocytes Absolute 0.7  0.1 - 1.0 K/uL   Eosinophils Relative 0  0 - 5 %   Eosinophils Absolute 0.0  0.0 - 0.7 K/uL   Basophils Relative 0  0 - 1 %   Basophils Absolute 0.0  0.0 - 0.1 K/uL  COMPREHENSIVE METABOLIC PANEL     Status: Abnormal   Collection Time    06/26/13  3:40 PM      Result Value Ref Range   Sodium 136 (*) 137 - 147 mEq/L   Potassium 3.8  3.7 - 5.3 mEq/L   Chloride 100  96 - 112 mEq/L   CO2 24  19 - 32 mEq/L   Glucose, Bld 110 (*) 70 - 99 mg/dL   BUN 20  6 - 23 mg/dL   Creatinine, Ser 1.08  0.50 - 1.35 mg/dL   Calcium 9.6  8.4 - 10.5 mg/dL   Total Protein 8.1  6.0 - 8.3 g/dL   Albumin 4.2  3.5 - 5.2 g/dL   AST 41 (*) 0 - 37 U/L   ALT 19  0 - 53 U/L   Alkaline Phosphatase 79  39 - 117 U/L   Total Bilirubin 0.9  0.3 - 1.2 mg/dL   GFR calc non Af Amer 83 (*) >90 mL/min   GFR calc Af Amer >90  >90 mL/min   Comment: (NOTE)     The eGFR has been calculated using the CKD EPI equation.     This calculation has not been validated in all clinical situations.     eGFR's persistently <90 mL/min signify possible Chronic Kidney     Disease.  ETHANOL     Status: None   Collection Time    06/26/13  3:40 PM      Result Value Ref Range   Alcohol, Ethyl (B) <11  0 - 11 mg/dL   Comment:            LOWEST DETECTABLE LIMIT FOR     SERUM ALCOHOL IS 11 mg/dL     FOR MEDICAL PURPOSES ONLY  ACETAMINOPHEN LEVEL     Status: None   Collection Time    06/26/13  3:40 PM      Result Value Ref Range   Acetaminophen (Tylenol), Serum <15.0  10 - 30 ug/mL   Comment:            THERAPEUTIC CONCENTRATIONS VARY     SIGNIFICANTLY. A RANGE OF 10-30     ug/mL MAY BE AN EFFECTIVE     CONCENTRATION FOR  MANY PATIENTS.     HOWEVER, SOME ARE BEST TREATED     AT CONCENTRATIONS OUTSIDE THIS     RANGE.     ACETAMINOPHEN CONCENTRATIONS     >150 ug/mL AT 4 HOURS  AFTER     INGESTION AND >50 ug/mL AT 12     HOURS AFTER INGESTION ARE     OFTEN ASSOCIATED WITH TOXIC     REACTIONS.  SALICYLATE LEVEL     Status: Abnormal   Collection Time    06/26/13  3:40 PM      Result Value Ref Range   Salicylate Lvl <6.3 (*) 2.8 - 20.0 mg/dL  URINALYSIS, ROUTINE W REFLEX MICROSCOPIC     Status: None   Collection Time    06/26/13  9:19 PM      Result Value Ref Range   Color, Urine YELLOW  YELLOW   APPearance CLEAR  CLEAR   Specific Gravity, Urine 1.006  1.005 - 1.030   pH 5.5  5.0 - 8.0   Glucose, UA NEGATIVE  NEGATIVE mg/dL   Hgb urine dipstick NEGATIVE  NEGATIVE   Bilirubin Urine NEGATIVE  NEGATIVE   Ketones, ur NEGATIVE  NEGATIVE mg/dL   Protein, ur NEGATIVE  NEGATIVE mg/dL   Urobilinogen, UA 0.2  0.0 - 1.0 mg/dL   Nitrite NEGATIVE  NEGATIVE   Leukocytes, UA NEGATIVE  NEGATIVE   Comment: MICROSCOPIC NOT DONE ON URINES WITH NEGATIVE PROTEIN, BLOOD, LEUKOCYTES, NITRITE, OR GLUCOSE <1000 mg/dL.  URINE RAPID DRUG SCREEN (HOSP PERFORMED)     Status: None   Collection Time    06/26/13  9:19 PM      Result Value Ref Range   Opiates NONE DETECTED  NONE DETECTED   Cocaine NONE DETECTED  NONE DETECTED   Benzodiazepines NONE DETECTED  NONE DETECTED   Amphetamines NONE DETECTED  NONE DETECTED   Tetrahydrocannabinol NONE DETECTED  NONE DETECTED   Barbiturates NONE DETECTED  NONE DETECTED   Comment:            DRUG SCREEN FOR MEDICAL PURPOSES     ONLY.  IF CONFIRMATION IS NEEDED     FOR ANY PURPOSE, NOTIFY LAB     WITHIN 5 DAYS.                LOWEST DETECTABLE LIMITS     FOR URINE DRUG SCREEN     Drug Class       Cutoff (ng/mL)     Amphetamine      1000     Barbiturate      200     Benzodiazepine   875     Tricyclics       643     Opiates          300     Cocaine          300     THC              50   Labs are reviewed and are pertinent for medical issues being treated.  Current Facility-Administered Medications  Medication Dose Route Frequency  Provider Last Rate Last Dose  . diphenhydrAMINE (BENADRYL) capsule 50 mg  50 mg Oral BID Durward Parcel, MD   50 mg at 06/28/13 0908  . lamoTRIgine (LAMICTAL) tablet 100 mg  100 mg Oral BID Durward Parcel, MD   100 mg at 06/28/13 0908  . LORazepam (ATIVAN) tablet 1 mg  1 mg Oral Q6H PRN Waylan Boga,  NP   1 mg at 06/27/13 1022  . LORazepam (ATIVAN) tablet 2 mg  2 mg Oral BID Durward Parcel, MD   2 mg at 06/28/13 0908  . nicotine (NICODERM CQ - dosed in mg/24 hours) patch 21 mg  21 mg Transdermal Daily Babette Relic, MD   21 mg at 06/28/13 0911  . ziprasidone (GEODON) capsule 40 mg  40 mg Oral BID WC Durward Parcel, MD   40 mg at 06/28/13 3202  . zolpidem (AMBIEN) tablet 5 mg  5 mg Oral QHS PRN Waylan Boga, NP   5 mg at 06/26/13 2205   No current outpatient prescriptions on file.    Psychiatric Specialty Exam:     Blood pressure 122/82, pulse 94, temperature 97.5 F (36.4 C), temperature source Oral, resp. rate 18, height 6' 3"  (1.905 m), weight 77.111 kg (170 lb), SpO2 100.00%.Body mass index is 21.25 kg/(m^2).  General Appearance: Disheveled  Eye Contact::  Good  Speech:  Normal Rate  Volume:  Normal  Mood:  Angry and Irritable  Affect:  Flat  Thought Process:  Irrelevant  Orientation:  Full (Time, Place, and Person)  Thought Content:  Hallucinations: Auditory Visual and Paranoid Ideation  Suicidal Thoughts:  No  Homicidal Thoughts:  No  Memory:  Immediate;   Fair Recent;   Fair Remote;   Poor  Judgement:  Impaired  Insight:  Lacking  Psychomotor Activity:  Normal  Concentration:  Fair  Recall:  Poor  Fund of Knowledge:Fair  Language: Good  Akathisia:  No  Handed:  Right  AIMS (if indicated):     Assets:  Resilience  Sleep:      Musculoskeletal: Strength & Muscle Tone: within normal limits Gait & Station: normal Patient leans: N/A  Treatment Plan Summary:  Recommended acute Inpatient psychiatric treatment recommended.    Monitor for safety and continue Geodon. Pending placement for inpatient unit.     De Nurse, Tieshia Rettinger MD 06/28/2013 10:14 AM

## 2013-06-28 NOTE — ED Notes (Signed)
Patient awake. Denies complaint. Reports being "an insomniac". Request bedside table and counter be cleaned.   Patient reminded of the time. Encouraged to rest, watch, tv, or read quietly. Patient pacing between room and restroom.   Safety maintained, Q 15 checks continue.

## 2013-06-28 NOTE — BHH Counselor (Addendum)
[place] [facility]. She gathered pt's clinical info from Clinical research associatewriter. Sherri reports pt's authorization # for [facility] is ONLY GOOD FOR THE NEXT 24 HOURS. She states that the accepting facility [facility]) will have to contact [facility] to get a new authorization number if pt not [facility] in next 24 hrs. Writer explained [facility] wait list process and again stated that pt hasn't been [facility] yet. Writer asked if TTS could contact [facility] again for new authorization number once [facility]. Sherri reports that the authorization number is only for an acute stay and pt most likely will be stabilized in Psych ED by the time pt is fully accepted [facility].  [facility] auth # is [id number].   Evette Cristalaroline Paige Dayton Sherr, [place] Assessment Counselor 3:00 pm   Per Tammy SoursGreg at [facility] [phone], pt has private insurance so [facility] can't provide an authorization number for [facility]. Writer will have to call Medicare [phone] or [facility] [phone].  Writer called Medicare  And spoke w/ Charlotte Sanesancy Rodriguez - [phone]. Ms. Sherlon HandingRodriguez reports that pt must give verbal consent over the phone to a Medicare rep before writer can gain access to pt's info. She sts that Medicare won't accept pt's written consent to release info to Medicare. Ms. Sherlon HandingRodriguez reports that pt may have a regional Medicare plan or may have Medicare Advantage and the application process for [facility] referral depends upon whatever Medicare plan pt has.  Writer called [facility] [phone]- [phone]. Spoke w/ care advocate Sherri re: authorization for [facility]. Sherri says that pt's Medicare is through their [employer]. Sherri says that pt's plan is out of [place] and therefore [facility] couldn't authorize pt for [facility] in [place]. Writer explains that pt is being declined at several hospitals in the area d/t pt's aggression. Sherri disputes this fact and says "This doesn't make sense. Psychiatric hospitals are locked units" and that these psych  hospitals can handle aggressive patients. Writer explains that in [place], [place], psych facilities often declined patients due to patient acuity including aggressiveness. Sherri reports that Clinical research associatewriter first has to try to get pt admitted to the local psych hospitals in network including [facility] (per TTS, pt has been declined d/t aggressive bx), [facility] and [facility]. Sherri sts she will call these hospitals and if pt is declined at the facilities, then she will have to speak w/ her clinical team and psychiatrist. Roanna RaiderSherri says she will call me back.  Evette Cristalaroline Paige Nattaly Yebra, [place] Assessment Counselor 12:00 PM

## 2013-06-28 NOTE — ED Notes (Signed)
Patient continues to be irritable but not agitated. 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 Writer encouraged and supported patient. Q 15 minute check continues

## 2013-06-29 DIAGNOSIS — F309 Manic episode, unspecified: Secondary | ICD-10-CM

## 2013-06-29 MED ORDER — ADULT MULTIVITAMIN W/MINERALS CH
1.0000 | ORAL_TABLET | Freq: Once | ORAL | Status: AC
  Administered 2013-06-29: 1 via ORAL
  Filled 2013-06-29: qty 1

## 2013-06-29 MED ORDER — LORAZEPAM 1 MG PO TABS
2.0000 mg | ORAL_TABLET | Freq: Once | ORAL | Status: AC
  Administered 2013-06-29: 2 mg via ORAL
  Filled 2013-06-29: qty 2

## 2013-06-29 NOTE — ED Notes (Signed)
Patient have been pacing the hallway intermittently within the past hour. Writer offered patient medication to help him rest. He refuse to take any medication, patient stated that he will stay up all night and write his book. He requested for beverage and more paper for the book "children book"; he said he is writing. Writer encouraged and supported patient, offered him some beverage. Q 15 minute check continues to maintain safety

## 2013-06-29 NOTE — ED Notes (Signed)
Patient was sleeping at the time will notify next tech

## 2013-06-29 NOTE — ED Notes (Signed)
Staff reported that patient going into another vacant room, he seemed restless. Writer offered patient his prn Risperdal M-tab; again he refused the medication. Will continues to monitor.

## 2013-06-29 NOTE — Consult Note (Signed)
  Psychiatric Specialty Exam: Physical Exam  ROS  Blood pressure 105/72, pulse 105, temperature 98 F (36.7 C), temperature source Oral, resp. rate 16, height 6\' 3"  (1.905 m), weight 77.111 kg (170 lb), SpO2 96.00%.Body mass index is 21.25 kg/(m^2).  General Appearance: Casual  Eye Contact::  Good  Speech:  Clear and Coherent  Volume:  Normal  Mood:  Euphoric  Affect:  Labile  Thought Process:  Coherent and Logical  Orientation:  Full (Time, Place, and Person)  Thought Content:  Negative  Suicidal Thoughts:  No  Homicidal Thoughts:  No  Memory:  Immediate;   Good Recent;   Good Remote;   Good  Judgement:  Impaired  Insight:  may have more insight than he shares with us  Psychomotor Activity:  Normal  Concentration:  Good  Recall:  Good  Akathisia:  Negative  Handed:  Right  AIMS (if indicated):     Assets:  Communication Skills  Sleep:   adequate   Adam [name] is refusing all meds.  He is hypomanic and denying any suicidal ideation or threats to others.  Reportedly, he was offering to buy a young boy at the mall some shoes and  was hugging him as well which was what reportedly prompted the police to bring him to the ER.  Today, he is happy talkative, somewhat intrusive, seems very bright and well read,challenges anything he does not agree with.  He does not believe anything is wrong and consequently does not believe he needs any medications.  He does not want to be in the hospital but because of the inappropriate behavior towards the minor is probably best being in the hospital to get his manic psychosis under more complete control.

## 2013-06-29 NOTE — Consult Note (Signed)
  Review of Systems  Constitutional: Negative.   HENT: Negative.   Eyes: Negative.   Respiratory: Negative.   Cardiovascular: Negative.   Gastrointestinal: Negative.   Genitourinary: Negative.   Musculoskeletal: Negative.   Skin: Negative.   Neurological: Negative.   Endo/Heme/Allergies: Negative.    Manic behavior is prominent, he refuses medications

## 2013-06-29 NOTE — ED Notes (Signed)
Patient resting quietly with eyes closed. Respirations even and unlabored. No distress noted.

## 2013-06-29 NOTE — ED Notes (Signed)
Patient resting quietly with eyes closed. Respirations even and unlabored. No distress noted . Q 15 minute check continues as ordered to maintain safety. 

## 2013-06-29 NOTE — ED Notes (Signed)
Patient walked up to the window and requested for razor to shave. Staff told patient that razor was not allowed on the unit due to safety reason. He became very agitated, angry and started cursing staff out. He was very loud and staff called the security. Before the security got to the unit, patient already went to his room to lay down in bed. Q 15 minute check continues as ordred to maintain safety.

## 2013-06-29 NOTE — ED Notes (Signed)
Adam HuaDavid continues to have pressured speech,ideas of reference and is grandiose and argumentative.  He was compliant with medications after being given administration options.  He is restless and pacing.  Continue redirection and support. Assess medication effectiveness.

## 2013-06-29 NOTE — BH Assessment (Signed)
Patient evaluated by Dr. Ladona Ridgelaylor and he [facility]. Adam [name] @ 1215 confirmed that [facility].  *[facility] Auth. # [id number]

## 2013-07-30 ENCOUNTER — Emergency Department (HOSPITAL_COMMUNITY)
Admission: EM | Admit: 2013-07-30 | Discharge: 2013-08-03 | Disposition: A | Discharge status: Home / Self Care | Payer: Medicaid - Out of State | Attending: Emergency Medicine | Admitting: Emergency Medicine

## 2013-07-30 ENCOUNTER — Encounter (HOSPITAL_COMMUNITY): Payer: Self-pay | Admitting: Emergency Medicine

## 2013-07-30 DIAGNOSIS — F29 Unspecified psychosis not due to a substance or known physiological condition: Secondary | ICD-10-CM | POA: Insufficient documentation

## 2013-07-30 DIAGNOSIS — B20 Human immunodeficiency virus [HIV] disease: Secondary | ICD-10-CM

## 2013-07-30 DIAGNOSIS — F988 Other specified behavioral and emotional disorders with onset usually occurring in childhood and adolescence: Secondary | ICD-10-CM | POA: Insufficient documentation

## 2013-07-30 DIAGNOSIS — IMO0002 Reserved for concepts with insufficient information to code with codable children: Secondary | ICD-10-CM | POA: Insufficient documentation

## 2013-07-30 DIAGNOSIS — Z21 Asymptomatic human immunodeficiency virus [HIV] infection status: Secondary | ICD-10-CM | POA: Insufficient documentation

## 2013-07-30 DIAGNOSIS — F17200 Nicotine dependence, unspecified, uncomplicated: Secondary | ICD-10-CM | POA: Insufficient documentation

## 2013-07-30 DIAGNOSIS — F4310 Post-traumatic stress disorder, unspecified: Secondary | ICD-10-CM | POA: Insufficient documentation

## 2013-07-30 DIAGNOSIS — J189 Pneumonia, unspecified organism: Secondary | ICD-10-CM | POA: Diagnosis present

## 2013-07-30 DIAGNOSIS — F319 Bipolar disorder, unspecified: Secondary | ICD-10-CM | POA: Insufficient documentation

## 2013-07-30 LAB — CBC WITH DIFFERENTIAL/PLATELET
BASOS ABS: 0 10*3/uL (ref 0.0–0.1)
BASOS PCT: 0 % (ref 0–1)
EOS ABS: 0 10*3/uL (ref 0.0–0.7)
EOS PCT: 0 % (ref 0–5)
HCT: 39.6 % (ref 39.0–52.0)
Hemoglobin: 14 g/dL (ref 13.0–17.0)
LYMPHS ABS: 1.9 10*3/uL (ref 0.7–4.0)
Lymphocytes Relative: 17 % (ref 12–46)
MCH: 31.1 pg (ref 26.0–34.0)
MCHC: 35.4 g/dL (ref 30.0–36.0)
MCV: 88 fL (ref 78.0–100.0)
Monocytes Absolute: 0.9 10*3/uL (ref 0.1–1.0)
Monocytes Relative: 8 % (ref 3–12)
NEUTROS PCT: 75 % (ref 43–77)
Neutro Abs: 8.4 10*3/uL — ABNORMAL HIGH (ref 1.7–7.7)
PLATELETS: 139 10*3/uL — AB (ref 150–400)
RBC: 4.5 MIL/uL (ref 4.22–5.81)
RDW: 12.7 % (ref 11.5–15.5)
WBC: 11.2 10*3/uL — ABNORMAL HIGH (ref 4.0–10.5)

## 2013-07-30 LAB — COMPREHENSIVE METABOLIC PANEL
ALBUMIN: 3.5 g/dL (ref 3.5–5.2)
ALK PHOS: 81 U/L (ref 39–117)
ALT: 36 U/L (ref 0–53)
AST: 104 U/L — AB (ref 0–37)
BUN: 18 mg/dL (ref 6–23)
CALCIUM: 8.8 mg/dL (ref 8.4–10.5)
CO2: 22 mEq/L (ref 19–32)
Chloride: 92 mEq/L — ABNORMAL LOW (ref 96–112)
Creatinine, Ser: 1.13 mg/dL (ref 0.50–1.35)
GFR calc non Af Amer: 79 mL/min — ABNORMAL LOW (ref >90)
GLUCOSE: 122 mg/dL — AB (ref 70–99)
POTASSIUM: 3.6 meq/L — AB (ref 3.7–5.3)
Sodium: 131 mEq/L — ABNORMAL LOW (ref 137–147)
Total Bilirubin: 1.2 mg/dL (ref 0.3–1.2)
Total Protein: 7.9 g/dL (ref 6.0–8.3)

## 2013-07-30 LAB — ETHANOL: Alcohol, Ethyl (B): <11 mg/dL (ref 0–11)

## 2013-07-30 MED ORDER — LORAZEPAM 2 MG/ML IJ SOLN
2.0000 mg | Freq: Once | INTRAMUSCULAR | Status: AC
  Administered 2013-07-30: 2 mg via INTRAMUSCULAR
  Filled 2013-07-30: qty 1

## 2013-07-30 MED ORDER — LORAZEPAM 1 MG PO TABS: 1.0000 mg | ORAL_TABLET | Freq: Three times a day (TID) | ORAL | Status: DC | PRN

## 2013-07-30 MED ORDER — STERILE WATER FOR INJECTION IJ SOLN
INTRAMUSCULAR | Status: AC
  Administered 2013-07-30: 1.2 mL
  Filled 2013-07-30: qty 10

## 2013-07-30 MED ORDER — ONDANSETRON HCL 4 MG PO TABS: 4.0000 mg | ORAL_TABLET | Freq: Three times a day (TID) | ORAL | Status: DC | PRN

## 2013-07-30 MED ORDER — ZIPRASIDONE MESYLATE 20 MG IM SOLR
20.0000 mg | Freq: Once | INTRAMUSCULAR | Status: AC
  Administered 2013-07-30: 20 mg via INTRAMUSCULAR
  Filled 2013-07-30: qty 20

## 2013-07-30 MED ORDER — IBUPROFEN 200 MG PO TABS: 600.0000 mg | ORAL_TABLET | Freq: Three times a day (TID) | ORAL | Status: DC | PRN

## 2013-07-30 NOTE — ED Provider Notes (Signed)
CSN: [id number]     Arrival date & time 07/30/13  2124 History   None    This chart was scribed for non-physician practitioner, Kyung BaccaKatie Marycruz Boehner PA-C, working with Leonette Mostharles B. Bernette MayersSheldon, MD by Arlan OrganAshley Leger, ED Scribe. This patient was seen in room WTR5/WTR5 and the patient's care was started at 9:50 PM.   No chief complaint on file.  HPI  LEVEL 5 CAVEAT DUE TO PSYCHOSIS   HPI Comments: Adam [name] is a 43 y.o. male with a PMHx of HIV, ADD, Bipolar Affective, or PTSDwho presents to the Emergency Department seeking medical clearance today. Pt was discharged yesterday from a short stay here in the hospital. Pt has now returned to the ED for similar behavior. He denies any pain at this time. No SI or HI.  Past Medical History  Diagnosis Date  . HIV (human immunodeficiency virus infection)   . ADD (attention deficit disorder)   . Bipolar affective   . PTSD (post-traumatic stress disorder)    Past Surgical History  Procedure Laterality Date  . Hernia repair     Family History  Problem Relation Age of Onset  . Seizures Mother    History  Substance Use Topics  . Smoking status: Current Every Day Smoker -- 0.50 packs/day    Types: Cigarettes  . Smokeless tobacco: Not on file  . Alcohol Use: No    Review of Systems  Unable to perform ROS: Psychiatric disorder      Allergies  Depakote and Haldol  Home Medications   Prior to Admission medications   Not on File   Triage Vitals: BP 145/93  Pulse 102  Temp(Src) 100.1 F (37.8 C) (Oral)  Resp 20  SpO2 98%   Physical Exam  Nursing note and vitals reviewed. Constitutional: He is oriented to person, place, and time. He appears well-developed and well-nourished. No distress.  HENT:  Head: Normocephalic and atraumatic.  Eyes:  Normal appearance  Neck: Normal range of motion.  Pulmonary/Chest: Effort normal.  Musculoskeletal: Normal range of motion.  Neurological: He is alert and oriented to person, place, and time.   Psychiatric: He has a normal mood and affect. His behavior is normal.  Pt moving eyes rapidly from left to right and up and down, makes little eye contact, responds to each of my statements w/ "why? When? Why? When?".  Pressured speech.  States that he on spring break. Agitated and argumentative.  Inappropriate, hysterical crying.  Urinated in his     ED Course  Procedures (including critical care time)  DIAGNOSTIC STUDIES: Oxygen Saturation is 98% on RA, Normal by my interpretation.    COORDINATION OF CARE: 9:36 PM-Discussed treatment plan with pt at bedside and pt agreed to plan.     Labs Review Labs Reviewed - No data to display  Imaging Review No results found.   EKG Interpretation None      MDM   Final diagnoses:  Psychosis    43yo M w/ ADD, bipolar affective and PTSD presents w/ acute psychosis.  IVC paperwork filled out and Geodon ordered.  Pt moving eyes rapidly from left to right and up and down, makes little eye contact, responds to each of my statements w/ "why? When? Why? When?".  States that he on spring break. Agitated and argumentative.  Inappropriate, hysterical crying.  Urinated in his pants.  Was released from ER yesterday after being placed on [facility] waiting list.  Labs pending.  Holding orders written.  10:23 PM  I personally performed the services described in this documentation, which was scribed in my presence. The recorded information has been reviewed and is accurate.    Otilio Miuatherine E Roshelle Traub, PA-C 07/31/13 0002

## 2013-07-30 NOTE — BH Assessment (Signed)
Received a call for a tele-assessment. Spoke with Helene KelpKatie Schnilever, PA-C who stated that pt went home on yesterday; however [facility]. Pt is not responding to others. Tele-assessment will be initiated.

## 2013-07-30 NOTE — ED Notes (Signed)
Pt arrive to the ED with a police escort. Pt has been acting erratic.  Pt was seen yesterday and [facility] but has returned for psychotic behavior.

## 2013-07-31 ENCOUNTER — Emergency Department (HOSPITAL_COMMUNITY): Payer: Medicaid - Out of State

## 2013-07-31 LAB — RAPID URINE DRUG SCREEN, HOSP PERFORMED
AMPHETAMINES: NOT DETECTED
BARBITURATES: NOT DETECTED
Benzodiazepines: NOT DETECTED
Cocaine: NOT DETECTED
Opiates: NOT DETECTED
Tetrahydrocannabinol: NOT DETECTED

## 2013-07-31 MED ORDER — ZIPRASIDONE MESYLATE 20 MG IM SOLR
20.0000 mg | Freq: Once | INTRAMUSCULAR | Status: AC
  Administered 2013-07-31: 20 mg via INTRAMUSCULAR

## 2013-07-31 MED ORDER — LORAZEPAM 2 MG/ML IJ SOLN: 2.0000 mg | INTRAMUSCULAR | Status: DC

## 2013-07-31 MED ORDER — STERILE WATER FOR INJECTION IJ SOLN
INTRAMUSCULAR | Status: AC
  Administered 2013-07-31: 07:00:00
  Filled 2013-07-31: qty 10

## 2013-07-31 MED ORDER — LORAZEPAM 1 MG PO TABS
2.0000 mg | ORAL_TABLET | Freq: Three times a day (TID) | ORAL | Status: DC | PRN
  Administered 2013-07-31 (×2): 2 mg via ORAL
  Filled 2013-07-31 (×5): qty 2

## 2013-07-31 MED ORDER — DIPHENHYDRAMINE HCL 25 MG PO CAPS
50.0000 mg | ORAL_CAPSULE | Freq: Once | ORAL | Status: AC
  Administered 2013-07-31: 50 mg via ORAL
  Filled 2013-07-31: qty 2

## 2013-07-31 MED ORDER — LORAZEPAM 1 MG PO TABS
2.0000 mg | ORAL_TABLET | Freq: Once | ORAL | Status: AC
  Administered 2013-07-31: 2 mg via ORAL
  Filled 2013-07-31: qty 2

## 2013-07-31 MED ORDER — DOXYCYCLINE HYCLATE 100 MG PO TABS
100.0000 mg | ORAL_TABLET | Freq: Two times a day (BID) | ORAL | Status: DC
Start: 2013-07-31 — End: 2013-08-02
  Administered 2013-07-31 – 2013-08-02 (×5): 100 mg via ORAL
  Filled 2013-07-31 (×5): qty 1

## 2013-07-31 MED ORDER — ZIPRASIDONE MESYLATE 20 MG IM SOLR
INTRAMUSCULAR | Status: AC
  Filled 2013-07-31: qty 20

## 2013-07-31 MED ORDER — AMOXICILLIN 500 MG PO CAPS
1000.0000 mg | ORAL_CAPSULE | Freq: Two times a day (BID) | ORAL | Status: DC
  Administered 2013-07-31 – 2013-08-02 (×5): 1000 mg via ORAL
  Filled 2013-07-31 (×5): qty 2

## 2013-07-31 NOTE — ED Notes (Addendum)
Pt reports that he "lives next door to here on [address]".  Pt reports that he walks here 3 times a day, yesterday he lost his back pack, the day before his duffle bag, and lost both his wallets yesterday and has no money.  Pt has also stated that he needs to leave because [place] for mothers day.

## 2013-07-31 NOTE — ED Notes (Addendum)
[facility], receiving MD is Dr. Phylis BougieMohammed Said, to call report @ this # [phone] after 0800 today.

## 2013-07-31 NOTE — ED Notes (Signed)
Up to the bathroom 

## 2013-07-31 NOTE — ED Notes (Signed)
Received call from [facility] requesting an update on patient; also requesting results of CD4 count, behaviors and medication compliance. Spoke with Lab who stated that the lab is resulted in house M-F only. [facility] stated they would call back after consulting with psychiatry and medical doctor.

## 2013-07-31 NOTE — ED Notes (Signed)
Psych and NP into talk w/ pt

## 2013-07-31 NOTE — BH Assessment (Signed)
Assessment Note  Adam BouchardDavid [name] is an 43 y.o. male who is escorted to the ED by [employer]. Pt is being uncooperative and will not disclose why he is in the ED or why [employer] escorted him to the ED. Pt stated "I don't know two guys dress in black brought me here, you ask  them".  Pt is oriented x1. Pt stated that today's date was "21-25-1 [id number]". Pt appears to be actively psychotic. Pt is disheveled, guarded and paranoid.  Pt responded to multiple questions by telling this clinician "none of your business", "who", "when", "why" or "never". Pt also asked this clinician what his BMI was and refused to provide details in regards to his sleep or weight loss. Pt denied any SI or previous suicide attempts. Pt also denied HI, AH and VH at this time. When asked if pt was employed pt stated "I am on spring break". Pt denied any previous mental health treatment; however his medical records indicate that he has a history of psychotic disordered NOS, PTSD and bipolar. When asked about his compliance with medication pt stated "never". When pt was asked about his substance use pt responded "no, everything is organic".   Axis I: Psychotic Disorder NOS Axis II: Deferred Axis III:  Past Medical History  Diagnosis Date  . HIV (human immunodeficiency virus infection)   . ADD (attention deficit disorder)   . Bipolar affective   . PTSD (post-traumatic stress disorder)    Axis IV: other psychosocial or environmental problems Axis V: 21-30 behavior considerably influenced by delusions or hallucinations OR serious impairment in judgment, communication OR inability to function in almost all areas  Past Medical History:  Past Medical History  Diagnosis Date  . HIV (human immunodeficiency virus infection)   . ADD (attention deficit disorder)   . Bipolar affective   . PTSD (post-traumatic stress disorder)     Past Surgical History  Procedure Laterality Date  . Hernia repair      Family History:  Family History   Problem Relation Age of Onset  . Seizures Mother     Social History:  reports that he has been smoking Cigarettes.  He has been smoking about 0.50 packs per day. He does not have any smokeless tobacco history on file. He reports that he does not drink alcohol or use illicit drugs.  Additional Social History:  Alcohol / Drug Use History of alcohol / drug use?: No history of alcohol / drug abuse  CIWA: CIWA-Ar BP: 120/70 mmHg Pulse Rate: 91 COWS:    Allergies:  Allergies  Allergen Reactions  . Depakote [Divalproex Sodium] Anaphylaxis and Other (See Comments)    Pt states one of these meds attacked his white blood cells  . Haldol [Haloperidol Lactate] Anaphylaxis and Other (See Comments)    Pt states one of these meds attacked his white blood cells    Home Medications:  (Not in a hospital admission)  OB/GYN Status:  No LMP for male patient.  General Assessment Data Location of Assessment: [facility] Is this a Tele or Face-to-Face Assessment?: Tele Assessment Is this an Initial Assessment or a Re-assessment for this encounter?: Initial Assessment Living Arrangements:  (Unable to assess ) Can pt return to current living arrangement?:  (unable to assess) Admission Status: Involuntary Is patient capable of signing voluntary admission?: No Transfer from: Acute Hospital Referral Source: Self/Family/Friend     [facility] Living Arrangements:  (Unable to assess )  Education Status Is patient currently in school?: No  Risk to self Suicidal Ideation: No Suicidal Intent: No Is patient at risk for suicide?: No Suicidal Plan?: No Access to Means: No What has been your use of drugs/alcohol within the last 12 months?: denied any drug use Previous Attempts/Gestures: No Triggers for Past Attempts: None known Intentional Self Injurious Behavior: None Family Suicide History: Unable to assess Recent stressful life event(s):  (Unable to assess) Persecutory voices/beliefs?:  No Depression: Yes Depression Symptoms: Isolating Substance abuse history and/or treatment for substance abuse?: No Suicide prevention information given to non-admitted patients: Not applicable  Risk to Others Homicidal Ideation: No Thoughts of Harm to Others: No Current Homicidal Intent: No Current Homicidal Plan: No Access to Homicidal Means: No Identified Victim: N/A History of harm to others?: No Assessment of Violence: None Noted Does patient have access to weapons?: No Criminal Charges Pending?: No Does patient have a court date: No  Psychosis Delusions: Grandiose  Mental Status Report Appear/Hygiene: Disheveled Eye Contact: Poor Motor Activity: Freedom of movement Speech: Rapid;Pressured;Loud;Abusive Level of Consciousness: Combative;Irritable Mood: Irritable;Sullen Affect: Irritable;Sullen Anxiety Level: Minimal Thought Processes: Circumstantial;Flight of Ideas Judgement: Impaired Orientation: Person Obsessive Compulsive Thoughts/Behaviors: None  Cognitive Functioning Concentration: Decreased Memory: Remote Impaired;Recent Impaired IQ: Average Insight: Poor Impulse Control: Poor Appetite:  (unable to assess) Weight Loss:  (Unable to assess. Pt stated "it's none of your business".) Weight Gain:  (unable to assess. Pt stated "it's none of your business".) Sleep:  (Pt reported "I never sleep".) Total Hours of Sleep:  (Unable to assess) Vegetative Symptoms:  (Unable to assess)  ADLScreening [facility]) Patient's cognitive ability adequate to safely complete daily activities?: Yes Patient able to express need for assistance with ADLs?: Yes Independently performs ADLs?: Yes (appropriate for developmental age)  Prior Inpatient Therapy Prior Inpatient Therapy:  (unable to assess)  Prior Outpatient Therapy Prior Outpatient Therapy:  (unable to assess)  ADL Screening (condition at time of admission) Patient's cognitive ability adequate to  safely complete daily activities?: Yes Is the patient deaf or have difficulty hearing?: No Does the patient have difficulty seeing, even when wearing glasses/contacts?: No Does the patient have difficulty concentrating, remembering, or making decisions?: No Patient able to express need for assistance with ADLs?: Yes Does the patient have difficulty dressing or bathing?: No Independently performs ADLs?: Yes (appropriate for developmental age)       Abuse/Neglect Assessment (Assessment to be complete while patient is alone) Physical Abuse: Denies Verbal Abuse: Denies Sexual Abuse: Denies Exploitation of patient/patient's resources: Denies Self-Neglect: Denies Values / Beliefs Cultural Requests During Hospitalization: None Spiritual Requests During Hospitalization: None        Additional Information 1:1 In Past 12 Months?:  (unable to assess) CIRT Risk: No Elopement Risk: No Does patient have medical clearance?: Yes      Disposition: Consulted with Alberteen SamFran Hobson, NP who agrees that patient meets inpatient criteria. [facility]. TTS will seek placement at other facilities. Notified Media plannerKatie Schnilever, PA-C of recommendation.  Disposition Initial Assessment Completed for this Encounter: Yes Disposition of Patient: Inpatient treatment program  On Site Evaluation by:   Reviewed with Physician:    Lahoma RockerLaquesta S Kohner Orlick 07/31/2013 12:50 AM

## 2013-07-31 NOTE — ED Notes (Addendum)
[facility] and gave report to [place]. While giving report, she stated that they do not take patients who are violent and have been in restraints. She talked with her supervisor and they do not want to take him at this time until he has had a chance to be on meds. Dewayne Hatchnn stated that they are going to keep him on the wait list for their facility and she will call back later today to check his status and maybe they will take him then.

## 2013-07-31 NOTE — ED Notes (Signed)
Pt continues to yell at staff and has removed restraints. [employer] officer at bedside with security. Pt was given water per request. Pt in NAD

## 2013-07-31 NOTE — ED Notes (Addendum)
Pt reports that the diarrhea occurred after he had a large stool this morning.  Pt noted to have cough w/ green sputum, reports that it has been going on x3 days.

## 2013-07-31 NOTE — ED Notes (Signed)
Greg TTS in w/ patient

## 2013-07-31 NOTE — ED Notes (Signed)
eating breakfast

## 2013-07-31 NOTE — ED Notes (Signed)
Pt's Belongings were placed in locker # 26.

## 2013-07-31 NOTE — Progress Notes (Signed)
0115: [facility] F[facility]ed [facility] the following: [facility]  Darsh Vandevoort [name] Disposition MHT

## 2013-07-31 NOTE — Progress Notes (Addendum)
[facility]: @0540  Gunnar Fusiaula called asked for HIV Medication history, reported it has not been documented. She stated she will check Medicare and give a call back.  [facility]: @0634  Gunnar Fusiaula stated pt has been accepted and can arrive anytime after 8AM. Pt should report to [address], accepting Dr Eilleen KempfMuhammad Saeed, RN Report should be called at [phone]    [name] Dis[facility] M[facility]

## 2013-07-31 NOTE — ED Notes (Signed)
Pt ambulated to psych ED with security, steady gait with no difficulty

## 2013-07-31 NOTE — ED Notes (Signed)
Up tot he bathroom to shower and change scrubs 

## 2013-07-31 NOTE — ED Notes (Signed)
Up to the desk on the phone 

## 2013-07-31 NOTE — ED Notes (Addendum)
to x ray ambulatory w/ security

## 2013-07-31 NOTE — BH Assessment (Signed)
[facility] requested an update at 2000 regarding Pt's current behavior. [facility] to speak with RN caring for Pt.  Harlin RainFord Ellis Ria CommentWarrick Jr, LPC, [facility] Triage Specialist [phone]

## 2013-07-31 NOTE — ED Notes (Signed)
Pt ambulatory to unit w/o difficulty security w/ pt

## 2013-07-31 NOTE — ED Provider Notes (Signed)
[facility], however as he had to be restrained this morning and given geodon, they will not accept him until he has had 12 hours off restraints.  Also pt has had some coughing- CXR ordered and shows a LLL pneumonia.  Xray images reviewed and interpreted by me.  Pt is not tahcypneic or hyoxic.  Pt started on dual therapy for cap due to his comorbidities.  Written for amoxicillin and doxycycline- no flouroquinolones due to QT prolongation risk with his other meds.    Ethelda ChickMartha K Linker, MD 07/31/13 1447

## 2013-07-31 NOTE — ED Notes (Addendum)
Up in the hall rambling, reports that they said I was yelling and I wasn't.  Pt reports that this has happened before when he was at a concert in 11/11 and went on to explain what had transpired concerning a derby hat and tee shirt.  Pt groggy, encouraged to return to room to wait for the MD to eval, PO fluids encouraged. Pt reports that [place] from [place].  Pt w/ rambling conversation and did not answer direct questions.

## 2013-07-31 NOTE — ED Notes (Addendum)
Pt sitting in room, still aggitated, yelling at times, argumentative,  Initially declined to take the ativan, but did

## 2013-07-31 NOTE — ED Notes (Signed)
Pt removed restraints himself with [employer] and security at bedside. Pt was given water to drink. Pt yelling but restraints not re-applied.

## 2013-07-31 NOTE — Progress Notes (Signed)
Adam GroveBrynn Marr: @0603  Requesting CD4 Lab and Viral Load. If pt is noncompliant with meds then he would not be appropriate for their facility. RN was contacted to see if there is collateral information regarding HIV medication still unknown. RN will request order with EDP for labs above.  [name] [name] Disposition MHT

## 2013-07-31 NOTE — ED Notes (Addendum)
Pt ambulated to  And from restroom with steady gait.

## 2013-07-31 NOTE — ED Notes (Signed)
Calm, up in room calmer, but still w/ intermittant outbursts. Pt coughing thick yellow sputum, tinged w/ blood.  NAD, PO fluids encouraged.  Pt reports that he feel that his breathing is better.  Breath sound CTA bilaterally.

## 2013-07-31 NOTE — ED Notes (Signed)
Pt sleeping quietly at this time. Even, unlabored resp. Will continue to monitor

## 2013-07-31 NOTE — Consult Note (Signed)
[facility]iat[facility] Consult   Reason for Consult:  "I want to go home"  Referring Physician:  EDP  Adam [name] is an 43 y.o. male. Total Time spent with patient: 30 minutes  Assessment: AXIS I:  Psychotic Disorder NOS AXIS II:  Deferred AXIS III:   Past Medical History  Diagnosis Date  . HIV (human immunodeficiency virus infection)   . ADD (attention deficit disorder)   . Bipolar affective   . PTSD (post-traumatic stress disorder)    AXIS IV:  other psychosocial or environmental problems AXIS V:  21-30 behavior considerably influenced by delusions or hallucinations OR serious impairment in judgment, communication OR inability to function in almost all areas  Plan:  Recommend psychiatric Inpatient admission when medically cleared.  Subjective:   Adam [name] is a 43 y.o. male patient admitted with IVC, escorted by [employer].  HPI:  Pt was interviewed with NP Josephine present. ED notes were reviewed, including assessment. Pt initially was lying in bed, faced away from this MD, refusing to answer questions asked of him. He asked "are you a Marketing executive, neurosurgeon, nuclear physicist?" Two hours later, he asked to speak to this MD again. Pt reports he rented a new home on March 15th in [place], and he previously lived in [place].  He was angry, loud, and irritable, stating "I don't know why I'm here", and demanding "I need to go home by tomorrow, and I will be angry if I am here on Mother's Day". When asked about SI/HI, pt denied, but asked "are you?". He denied AVH. Pt is complaining of cough and diarrhea.  Per record review, pt was psychotic when brought to the ED, paranoid and guarded. Pt was restrained last night. Pt received PRN geodon for agitation this AM. Pt presented to ED on 06/28/13 with similar presentation.  HPI Elements:   Location:  pt refuses to answer. Quality:  pt refuses to answer. Severity:  pt refuses to answer. Timing:  pt refuses to answer. Duration:  pt  refuses to answer. Context:  pt refuses to answer.  Past Psychiatric History: Past Medical History  Diagnosis Date  . HIV (human immunodeficiency virus infection)   . ADD (attention deficit disorder)   . Bipolar affective   . PTSD (post-traumatic stress disorder)     reports that he has been smoking Cigarettes.  He has been smoking about 0.50 packs per day. He does not have any smokeless tobacco history on file. He reports that he does not drink alcohol or use illicit drugs. Family History  Problem Relation Age of Onset  . Seizures Mother    Family History Substance Abuse: No Family Supports: Yes, List: ("My family, my mom, dad, my brother, my brother". ) Living Arrangements:  (Unable to assess ) Can pt return to current living arrangement?:  (unable to assess) Abuse/Neglect [facility]) Physical Abuse: Denies Verbal Abuse: Denies Sexual Abuse: Denies Allergies:   Allergies  Allergen Reactions  . Depakote [Divalproex Sodium] Anaphylaxis and Other (See Comments)    Pt states one of these meds attacked his white blood cells  . Haldol [Haloperidol Lactate] Anaphylaxis and Other (See Comments)    Pt states one of these meds attacked his white blood cells    ACT Assessment Complete:  Yes:    Educational Status    Risk to Self: Risk to self Suicidal Ideation: No Suicidal Intent: No Is patient at risk for suicide?: No Suicidal Plan?: No Access to Means: No What has been your use of drugs/alcohol within  the last 12 months?: denied any drug use Previous Attempts/Gestures: No Triggers for Past Attempts: None known Intentional Self Injurious Behavior: None Family Suicide History: Unable to assess Recent stressful life event(s):  (Unable to assess) Persecutory voices/beliefs?: No Depression: Yes Depression Symptoms: Isolating Substance abuse history and/or treatment for substance abuse?: No Suicide prevention information given to non-admitted patients: Not applicable  Risk to  Others: Risk to Others Homicidal Ideation: No Thoughts of Harm to Others: No Current Homicidal Intent: No Current Homicidal Plan: No Access to Homicidal Means: No Identified Victim: N/A History of harm to others?: No Assessment of Violence: None Noted Does patient have access to weapons?: No Criminal Charges Pending?: No Does patient have a court date: No  Abuse: Abuse/Neglect Assessment (Assessment to be complete while patient is alone) Physical Abuse: Denies Verbal Abuse: Denies Sexual Abuse: Denies Exploitation of patient/patient's resources: Denies Self-Neglect: Denies  Prior Inpatient Therapy: Prior Inpatient Therapy Prior Inpatient Therapy:  (unable to assess)  Prior Outpatient Therapy: Prior Outpatient Therapy Prior Outpatient Therapy:  (unable to assess)  Additional Information: Additional Information 1:1 In Past 12 Months?:  (unable to assess) CIRT Risk: No Elopement Risk: No Does patient have medical clearance?: Yes                  Objective: Blood pressure 113/66, pulse 89, temperature 99 F (37.2 C), temperature source Axillary, resp. rate 20, SpO2 96.00%.There is no weight on file to calculate BMI. Results for orders placed during the hospital encounter of 07/30/13 (from the past 72 hour(s))  CBC WITH DIFFERENTIAL     Status: Abnormal   Collection Time    07/30/13 10:45 PM      Result Value Ref Range   WBC 11.2 (*) 4.0 - 10.5 K/uL   RBC 4.50  4.22 - 5.81 MIL/uL   Hemoglobin 14.0  13.0 - 17.0 g/dL   HCT 39.6  39.0 - 52.0 %   MCV 88.0  78.0 - 100.0 fL   MCH 31.1  26.0 - 34.0 pg   MCHC 35.4  30.0 - 36.0 g/dL   RDW 12.7  11.5 - 15.5 %   Platelets 139 (*) 150 - 400 K/uL   Neutrophils Relative % 75  43 - 77 %   Neutro Abs 8.4 (*) 1.7 - 7.7 K/uL   Lymphocytes Relative 17  12 - 46 %   Lymphs Abs 1.9  0.7 - 4.0 K/uL   Monocytes Relative 8  3 - 12 %   Monocytes Absolute 0.9  0.1 - 1.0 K/uL   Eosinophils Relative 0  0 - 5 %   Eosinophils Absolute  0.0  0.0 - 0.7 K/uL   Basophils Relative 0  0 - 1 %   Basophils Absolute 0.0  0.0 - 0.1 K/uL  COMPREHENSIVE METABOLIC PANEL     Status: Abnormal   Collection Time    07/30/13 10:45 PM      Result Value Ref Range   Sodium 131 (*) 137 - 147 mEq/L   Potassium 3.6 (*) 3.7 - 5.3 mEq/L   Chloride 92 (*) 96 - 112 mEq/L   CO2 22  19 - 32 mEq/L   Glucose, Bld 122 (*) 70 - 99 mg/dL   BUN 18  6 - 23 mg/dL   Creatinine, Ser 1.13  0.50 - 1.35 mg/dL   Calcium 8.8  8.4 - 10.5 mg/dL   Total Protein 7.9  6.0 - 8.3 g/dL   Albumin 3.5  3.5 - 5.2 g/dL  AST 104 (*) 0 - 37 U/L   ALT 36  0 - 53 U/L   Alkaline Phosphatase 81  39 - 117 U/L   Total Bilirubin 1.2  0.3 - 1.2 mg/dL   GFR calc non Af Amer 79 (*) >90 mL/min   GFR calc Af Amer >90  >90 mL/min   Comment: (NOTE)     The eGFR has been calculated using the CKD EPI equation.     This calculation has not been validated in all clinical situations.     eGFR's persistently <90 mL/min signify possible Chronic Kidney     Disease.  ETHANOL     Status: None   Collection Time    07/30/13 10:45 PM      Result Value Ref Range   Alcohol, Ethyl (B) <11  0 - 11 mg/dL   Comment:            LOWEST DETECTABLE LIMIT FOR     SERUM ALCOHOL IS 11 mg/dL     FOR MEDICAL PURPOSES ONLY  URINE RAPID DRUG SCREEN (HOSP PERFORMED)     Status: None   Collection Time    07/31/13  4:54 AM      Result Value Ref Range   Opiates NONE DETECTED  NONE DETECTED   Cocaine NONE DETECTED  NONE DETECTED   Benzodiazepines NONE DETECTED  NONE DETECTED   Amphetamines NONE DETECTED  NONE DETECTED   Tetrahydrocannabinol NONE DETECTED  NONE DETECTED   Barbiturates NONE DETECTED  NONE DETECTED   Comment:            DRUG SCREEN FOR MEDICAL PURPOSES     ONLY.  IF CONFIRMATION IS NEEDED     FOR ANY PURPOSE, NOTIFY LAB     WITHIN 5 DAYS.                LOWEST DETECTABLE LIMITS     FOR URINE DRUG SCREEN     Drug Class       Cutoff (ng/mL)     Amphetamine      1000     Barbiturate       200     Benzodiazepine   983     Tricyclics       382     Opiates          300     Cocaine          300     THC              50   Labs are reviewed and are pertinent for elevated AST and glucose.  Current Facility-Administered Medications  Medication Dose Route Frequency Provider Last Rate Last Dose  . ibuprofen (ADVIL,MOTRIN) tablet 600 mg  600 mg Oral Q8H PRN Remer Macho, PA-C      . LORazepam (ATIVAN) tablet 2 mg  2 mg Oral Q8H PRN Delfin Gant, NP   2 mg at 07/31/13 1100  . ondansetron (ZOFRAN) tablet 4 mg  4 mg Oral Q8H PRN Remer Macho, PA-C       No current outpatient prescriptions on file.    Psychiatric Specialty Exam: Physical Exam  ROS  Blood pressure 113/66, pulse 89, temperature 99 F (37.2 C), temperature source Axillary, resp. rate 20, SpO2 96.00%.There is no weight on file to calculate BMI.  General Appearance: Bizarre, Disheveled and Guarded  Eye Contact::  Poor  Speech:  Loud  Volume:  Increased  Mood:  Angry and  Irritable  Affect:  Congruent and Labile  Thought Process:  Irrelevant and Tangential  Orientation:  Other:  He is oriented to person and time. He knew he was in [place]. He was unsure if he was in the ED or [facility].   Thought Content:  Paranoid Ideation  Suicidal Thoughts:  No  Homicidal Thoughts:  No  Memory:  Immediate;   Poor Recent;   Poor Remote;   Fair  Judgement:  Poor  Insight:  Shallow  Psychomotor Activity:  Increased and Restlessness  Concentration:  Poor  Recall:  Poor  Fund of Knowledge:Fair  Language: Fair  Akathisia:  No  Handed:  Right  AIMS (if indicated):     Assets:  Resilience  Sleep:      Musculoskeletal: Strength & Muscle Tone: within normal limits Gait & Station: normal Patient leans: N/A  Treatment Plan Summary: Recommend acute inpatient psychiatric treatment. Currently on wait list for [facility], who is willing to accept pt if he is out of restraints for 12 hours. increase ativan to 2 mg  q8hrs prn anxiety/agitation. obtain CXR re: cough. monitor for safety.  Skip Estimable MD 07/31/2013 11:56 AM

## 2013-07-31 NOTE — ED Notes (Signed)
Up to the desk, yelling, banging on the glass, yelling that we are liars...don't uinderstand...has [place] the woods [place]..."  Pt beligerant,will not return to room, yelling and arguing w/ the [facility] Dr Fayrene FearingJames aware may give PO ativan now and repeat IM if needed.

## 2013-07-31 NOTE — ED Notes (Signed)
Up to the desk,angry about having to stay "I'm locked in a fu.... Box again..I didn't do anything..."  Pt angry, wanting to leave.  Greg TTS  In to talk to patient.

## 2013-07-31 NOTE — Progress Notes (Signed)
[facility]  Needs to be observed in ER for 12 hours and has to be out of restraints and non-violent and redirectable.    TTS to call at 2000 and give update on progress of pt.  If pt is doing well [facility]  Currently they are holding bed for further info.  [phone]

## 2013-07-31 NOTE — ED Notes (Signed)
Pt back down to his room, calmer,but still argumentative.

## 2013-07-31 NOTE — ED Notes (Signed)
Julieanne CottonJosephine NP and MD into

## 2013-07-31 NOTE — ED Notes (Signed)
Pt up to the bathroom to shower and change scrubs after episode of diarrhea

## 2013-08-01 MED ORDER — ELVITEG-COBIC-EMTRICIT-TENOFDF 150-150-200-300 MG PO TABS
1.0000 | ORAL_TABLET | Freq: Every day | ORAL | Status: DC
  Administered 2013-08-01 – 2013-08-03 (×3): 1 via ORAL
  Filled 2013-08-01 (×4): qty 1

## 2013-08-01 MED ORDER — ADULT MULTIVITAMIN W/MINERALS CH
1.0000 | ORAL_TABLET | Freq: Once | ORAL | Status: AC
  Administered 2013-08-01: 1 via ORAL
  Filled 2013-08-01: qty 1

## 2013-08-01 MED ORDER — DIPHENHYDRAMINE HCL 25 MG PO CAPS
50.0000 mg | ORAL_CAPSULE | Freq: Two times a day (BID) | ORAL | Status: DC
  Administered 2013-08-01 – 2013-08-03 (×5): 50 mg via ORAL
  Filled 2013-08-01 (×5): qty 2

## 2013-08-01 MED ORDER — WHITE PETROLATUM GEL
Status: DC | PRN
Start: 2013-08-01 — End: 2013-08-03
  Filled 2013-08-01: qty 5

## 2013-08-01 MED ORDER — LAMOTRIGINE 5 MG PO CHEW
100.0000 mg | CHEWABLE_TABLET | Freq: Two times a day (BID) | ORAL | Status: DC
  Filled 2013-08-01 (×2): qty 20

## 2013-08-01 MED ORDER — LAMOTRIGINE 100 MG PO TABS
100.0000 mg | ORAL_TABLET | Freq: Two times a day (BID) | ORAL | Status: DC
  Administered 2013-08-01: 100 mg via ORAL
  Filled 2013-08-01 (×6): qty 1

## 2013-08-01 MED ORDER — ADULT MULTIVITAMIN W/MINERALS CH
1.0000 | ORAL_TABLET | Freq: Every day | ORAL | Status: DC
  Administered 2013-08-02: 1 via ORAL
  Filled 2013-08-01: qty 1

## 2013-08-01 MED ORDER — ZIPRASIDONE HCL 20 MG PO CAPS
40.0000 mg | ORAL_CAPSULE | Freq: Two times a day (BID) | ORAL | Status: DC
  Administered 2013-08-03: 40 mg via ORAL
  Filled 2013-08-01 (×3): qty 2

## 2013-08-01 NOTE — ED Notes (Signed)
Up standing at other patinets door, redirected back to room

## 2013-08-01 NOTE — ED Notes (Signed)
Up in hall talking w/ Berna SpareMarcus

## 2013-08-01 NOTE — ED Notes (Signed)
Josephine NP into see 

## 2013-08-01 NOTE — ED Notes (Signed)
md and josephine np into see

## 2013-08-01 NOTE — ED Notes (Signed)
Took medications w/o difficulty, remains calm, cooperative, EDP aware of patients request to speak w/ him. PO fluids encouraged

## 2013-08-01 NOTE — ED Notes (Signed)
Pt loud at times, but redirectable

## 2013-08-01 NOTE — ED Notes (Signed)
Talking with NP at the desk

## 2013-08-01 NOTE — ED Notes (Signed)
Up to the desk on the phone 

## 2013-08-01 NOTE — Progress Notes (Signed)
Pt interviewed with NP. Agree with above assessment and plan. 

## 2013-08-01 NOTE — ED Notes (Signed)
EDP aware of blood tinged sputum

## 2013-08-01 NOTE — ED Notes (Addendum)
Pt requesting to speak w/ chaplin-chaplin paged and will be in this morning to talk w/ pt, pt aware.  Pt calm and cooperative

## 2013-08-01 NOTE — ED Provider Notes (Signed)
Medical screening examination/treatment/procedure(s) were performed by non-physician practitioner and as supervising physician I was immediately available for consultation/collaboration.   EKG Interpretation   Date/Time:  Friday Jul 30 2013 22:36:12 EDT Ventricular Rate:  109 PR Interval:  141 QRS Duration: 92 QT Interval:  320 QTC Calculation: 431 R Axis:   90 Text Interpretation:  Sinus tachycardia Borderline right axis deviation ST  elev, probable normal early repol pattern ED PHYSICIAN INTERPRETATION  AVAILABLE IN [facility] Confirmed by TEST, Record ([id number]) on  08/01/2013 12:37:11 PM        Charles B. Bernette MayersSheldon, MD 08/01/13 [id number]

## 2013-08-01 NOTE — ED Notes (Signed)
Up in hall talking w/ Berna SpareMarcus TTS

## 2013-08-01 NOTE — ED Notes (Signed)
Chaplin into see 

## 2013-08-01 NOTE — Progress Notes (Signed)
Patient ID: Adam Bouchardavid Daily, male   DOB: 1971/03/08, 43 y.o.   MRN: [id number] Diagnosis:  Psychotic d/o  Psychiatric Specialty Exam: Physical Exam-  Still having productive cough, lungs sounds clear but diminished.  Started antibiotic yesterday for Pneumonia.   ROS  Blood pressure 112/75, pulse 103, temperature 98 F (36.7 C), temperature source Oral, resp. rate 16, SpO2 95.00%.There is no weight on file to calculate BMI.  General Appearance: Casual and Disheveled  Eye Contact::  Good  Speech:  Pressured  Volume:  Increased  Mood:  Angry and Irritable  Affect:  Congruent and Flat  Thought Process:  Coherent  Orientation:  Full (Time, Place, and Person)  Thought Content:  WDL  Suicidal Thoughts:  No  Homicidal Thoughts:  No  Memory:  Immediate;   Good Recent;   Good Remote;   Good  Judgement:  Poor  Insight:  Lacking  Psychomotor Activity:  Normal  Concentration:  Poor  Recall:  NA  Akathisia:  NA  Handed:  Right  AIMS (if indicated):     Assets:  Others:  "Just want to go home, send me home"  Sleep:      Patient was seen and evaluated by Dr Tawni CarnesSaranga and this Clinical research associatewriter.  He is still irritable, antagonistic and lack insight to his illness.  He is asking to be discharged home and believe he is not sick.  Patient adamantly believe his medications does not work and that he does not need them.  Patient is constantly coming over the the nursing station to ask for different things and to make phone calls.  Patient constantly need redirecting back to his room and is loud on the phone.  Plan: We will continue with our plan of care We have restarted him on his 1) Lamictal 100 mg po daily for his mood 2) We have started him on Geodone 40 mg po with food BID for his mood/Psychosis.  Discussed with our Pharmacist, patient tolerated and took medication last visit in April 3)Benadryl 50 mg po BID for allergy Patient will continue on his medications for his medical needs   We will continue to provide  safety and attend to his needs.   Dahlia ByesJosephine Demetrios Byron   PMHNP-BC

## 2013-08-01 NOTE — BH Assessment (Signed)
BHH Assessment Progress Note   Adam [name] from [facility] called.  She said that they need to know that patient's pneumonia is clearing.  She mentioned that his viral load would need to be lower.  Adam [name] said that they are still interested but that patient's whole packet would need to be resubmitted after 24 hours of the medical issue being cleared.

## 2013-08-01 NOTE — ED Notes (Signed)
Talking quietly with the chaplin

## 2013-08-01 NOTE — ED Notes (Signed)
Up to the bathroom 

## 2013-08-02 DIAGNOSIS — F319 Bipolar disorder, unspecified: Secondary | ICD-10-CM

## 2013-08-02 DIAGNOSIS — J189 Pneumonia, unspecified organism: Secondary | ICD-10-CM | POA: Diagnosis present

## 2013-08-02 DIAGNOSIS — F29 Unspecified psychosis not due to a substance or known physiological condition: Secondary | ICD-10-CM

## 2013-08-02 DIAGNOSIS — B20 Human immunodeficiency virus [HIV] disease: Secondary | ICD-10-CM | POA: Diagnosis present

## 2013-08-02 LAB — T-HELPER CELLS (CD4) COUNT (NOT AT ARMC)
CD4 T CELL ABS: 380 /uL — AB (ref 400–2700)
CD4 T CELL HELPER: 28 % — AB (ref 33–55)

## 2013-08-02 MED ORDER — LORAZEPAM 2 MG/ML IJ SOLN: 2.0000 mg | INTRAMUSCULAR | Status: DC | PRN

## 2013-08-02 MED ORDER — AMOXICILLIN-POT CLAVULANATE ER 1000-62.5 MG PO TB12
2.0000 | ORAL_TABLET | Freq: Two times a day (BID) | ORAL | Status: DC
  Filled 2013-08-02 (×2): qty 2

## 2013-08-02 MED ORDER — AMOXICILLIN-POT CLAVULANATE 875-125 MG PO TABS
1.0000 | ORAL_TABLET | Freq: Two times a day (BID) | ORAL | Status: DC
  Administered 2013-08-02 – 2013-08-03 (×2): 1 via ORAL
  Filled 2013-08-02 (×2): qty 1

## 2013-08-02 MED ORDER — AMOXICILLIN-POT CLAVULANATE 875-125 MG PO TABS: 1.0000 | ORAL_TABLET | Freq: Two times a day (BID) | ORAL | Status: DC

## 2013-08-02 NOTE — ED Provider Notes (Signed)
T/C from Social Worker: [facility] with multiple questions regarding pt's PMHx and current medical conditions/treatment.  T/C to [facility] Dr. Jomarie LongsJoseph, case discussed, including:  HPI, pertinent PM/SHx, VS/PE, dx testing, ED course and treatment:  Agreeable to consult to help clarify pt's medical conditions/treatment.    Laray AngerKathleen M Jayland Null, DO 08/02/13 [phone]

## 2013-08-02 NOTE — Consult Note (Signed)
[facility] Consult   Reason for Consult:  "I want to go home"  Referring Physician:  EDP  Haakon Titsworth is an 43 y.o. male. Total Time spent with patient: 30 minutes  Assessment: AXIS I:  Psychotic Disorder NOS AXIS II:  Deferred AXIS III:   Past Medical History  Diagnosis Date  . HIV (human immunodeficiency virus infection)   . ADD (attention deficit disorder)   . Bipolar affective   . PTSD (post-traumatic stress disorder)    AXIS IV:  other psychosocial or environmental problems AXIS V:  21-30 behavior considerably influenced by delusions or hallucinations OR serious impairment in judgment, communication OR inability to function in almost all areas  Plan:  Recommend psychiatric Inpatient admission when medically cleared.  Subjective:   Rocky Gladden is a 43 y.o. male patient admitted with IVC, escorted by [employer].  HPI:  Pt was interviewed with NP Theodoro Clock present. ED notes were reviewed, including assessment. Pt initially was lying in bed, faced away from this MD, refusing to answer questions asked of him. He asked "are you a Marketing executive, neurosurgeon, nuclear physicist?" Two hours later, he asked to speak to this MD again. Pt reports he rented a new home on March 15th in [place], and he previously lived in [place].  He was angry, loud, and irritable, stating "I don't know why I'm here", and demanding "I need to go home by tomorrow, and I will be angry if I am here on Mother's Day". When asked about SI/HI, pt denied, but asked "are you?". He denied AVH. Pt is complaining of cough and diarrhea.  Per record review, pt was psychotic when brought to the ED, paranoid and guarded. Pt was restrained last night. Pt received PRN geodon for agitation this AM. Pt presented to ED on 06/28/13 with similar presentation.  HPI Elements:   Location:  pt refuses to answer. Quality:  pt refuses to answer. Severity:  pt refuses to answer. Timing:  pt refuses to answer. Duration:  pt  refuses to answer. Context:  pt refuses to answer.  Past Psychiatric History: Past Medical History  Diagnosis Date  . HIV (human immunodeficiency virus infection)   . ADD (attention deficit disorder)   . Bipolar affective   . PTSD (post-traumatic stress disorder)     reports that he has been smoking Cigarettes.  He has been smoking about 0.50 packs per day. He does not have any smokeless tobacco history on file. He reports that he does not drink alcohol or use illicit drugs. Family History  Problem Relation Age of Onset  . Seizures Mother    Family History Substance Abuse: No Family Supports: Yes, List: ("My family, my mom, dad, my brother, my brother". ) Living Arrangements:  (Unable to assess ) Can pt return to current living arrangement?:  (unable to assess) Abuse/Neglect [place]) Physical Abuse: Denies Verbal Abuse: Denies Sexual Abuse: Denies Allergies:   Allergies  Allergen Reactions  . Depakote [Divalproex Sodium] Anaphylaxis and Other (See Comments)    Pt states one of these meds attacked his white blood cells  . Haldol [Haloperidol Lactate] Anaphylaxis and Other (See Comments)    Pt states one of these meds attacked his white blood cells    ACT Assessment Complete:  Yes:    Educational Status    Risk to Self: Risk to self Suicidal Ideation: No Suicidal Intent: No Is patient at risk for suicide?: No Suicidal Plan?: No Access to Means: No What has been your use of drugs/alcohol within  the last 12 months?: denied any drug use Previous Attempts/Gestures: No Triggers for Past Attempts: None known Intentional Self Injurious Behavior: None Family Suicide History: Unable to assess Recent stressful life event(s):  (Unable to assess) Persecutory voices/beliefs?: No Depression: Yes Depression Symptoms: Isolating Substance abuse history and/or treatment for substance abuse?: No Suicide prevention information given to non-admitted patients: Not applicable  Risk to  Others: Risk to Others Homicidal Ideation: No Thoughts of Harm to Others: No Current Homicidal Intent: No Current Homicidal Plan: No Access to Homicidal Means: No Identified Victim: N/A History of harm to others?: No Assessment of Violence: None Noted Does patient have access to weapons?: No Criminal Charges Pending?: No Does patient have a court date: No  Abuse: Abuse/Neglect Assessment (Assessment to be complete while patient is alone) Physical Abuse: Denies Verbal Abuse: Denies Sexual Abuse: Denies Exploitation of patient/patient's resources: Denies Self-Neglect: Denies  Prior Inpatient Therapy: Prior Inpatient Therapy Prior Inpatient Therapy:  (unable to assess)  Prior Outpatient Therapy: Prior Outpatient Therapy Prior Outpatient Therapy:  (unable to assess)  Additional Information: Additional Information 1:1 In Past 12 Months?:  (unable to assess) CIRT Risk: No Elopement Risk: No Does patient have medical clearance?: Yes                  Objective: Blood pressure 114/85, pulse 103, temperature 97.8 F (36.6 C), temperature source Oral, resp. rate 20, SpO2 99.00%.There is no weight on file to calculate BMI. Results for orders placed during the hospital encounter of 07/30/13 (from the past 72 hour(s))  CBC WITH DIFFERENTIAL     Status: Abnormal   Collection Time    07/30/13 10:45 PM      Result Value Ref Range   WBC 11.2 (*) 4.0 - 10.5 K/uL   RBC 4.50  4.22 - 5.81 MIL/uL   Hemoglobin 14.0  13.0 - 17.0 g/dL   HCT 39.6  39.0 - 52.0 %   MCV 88.0  78.0 - 100.0 fL   MCH 31.1  26.0 - 34.0 pg   MCHC 35.4  30.0 - 36.0 g/dL   RDW 12.7  11.5 - 15.5 %   Platelets 139 (*) 150 - 400 K/uL   Neutrophils Relative % 75  43 - 77 %   Neutro Abs 8.4 (*) 1.7 - 7.7 K/uL   Lymphocytes Relative 17  12 - 46 %   Lymphs Abs 1.9  0.7 - 4.0 K/uL   Monocytes Relative 8  3 - 12 %   Monocytes Absolute 0.9  0.1 - 1.0 K/uL   Eosinophils Relative 0  0 - 5 %   Eosinophils Absolute  0.0  0.0 - 0.7 K/uL   Basophils Relative 0  0 - 1 %   Basophils Absolute 0.0  0.0 - 0.1 K/uL  COMPREHENSIVE METABOLIC PANEL     Status: Abnormal   Collection Time    07/30/13 10:45 PM      Result Value Ref Range   Sodium 131 (*) 137 - 147 mEq/L   Potassium 3.6 (*) 3.7 - 5.3 mEq/L   Chloride 92 (*) 96 - 112 mEq/L   CO2 22  19 - 32 mEq/L   Glucose, Bld 122 (*) 70 - 99 mg/dL   BUN 18  6 - 23 mg/dL   Creatinine, Ser 1.13  0.50 - 1.35 mg/dL   Calcium 8.8  8.4 - 10.5 mg/dL   Total Protein 7.9  6.0 - 8.3 g/dL   Albumin 3.5  3.5 - 5.2 g/dL  AST 104 (*) 0 - 37 U/L   ALT 36  0 - 53 U/L   Alkaline Phosphatase 81  39 - 117 U/L   Total Bilirubin 1.2  0.3 - 1.2 mg/dL   GFR calc non Af Amer 79 (*) >90 mL/min   GFR calc Af Amer >90  >90 mL/min   Comment: (NOTE)     The eGFR has been calculated using the CKD EPI equation.     This calculation has not been validated in all clinical situations.     eGFR's persistently <90 mL/min signify possible Chronic Kidney     Disease.  ETHANOL     Status: None   Collection Time    07/30/13 10:45 PM      Result Value Ref Range   Alcohol, Ethyl (B) <11  0 - 11 mg/dL   Comment:            LOWEST DETECTABLE LIMIT FOR     SERUM ALCOHOL IS 11 mg/dL     FOR MEDICAL PURPOSES ONLY  URINE RAPID DRUG SCREEN (HOSP PERFORMED)     Status: None   Collection Time    07/31/13  4:54 AM      Result Value Ref Range   Opiates NONE DETECTED  NONE DETECTED   Cocaine NONE DETECTED  NONE DETECTED   Benzodiazepines NONE DETECTED  NONE DETECTED   Amphetamines NONE DETECTED  NONE DETECTED   Tetrahydrocannabinol NONE DETECTED  NONE DETECTED   Barbiturates NONE DETECTED  NONE DETECTED   Comment:            DRUG SCREEN FOR MEDICAL PURPOSES     ONLY.  IF CONFIRMATION IS NEEDED     FOR ANY PURPOSE, NOTIFY LAB     WITHIN 5 DAYS.                LOWEST DETECTABLE LIMITS     FOR URINE DRUG SCREEN     Drug Class       Cutoff (ng/mL)     Amphetamine      1000     Barbiturate       200     Benzodiazepine   794     Tricyclics       801     Opiates          300     Cocaine          300     THC              50  T-HELPER CELLS (CD4) COUNT     Status: Abnormal   Collection Time    07/31/13  7:00 AM      Result Value Ref Range   CD4 T Cell Abs 380 (*) 400 - 2700 /uL   CD4 % Helper T Cell 28 (*) 33 - 55 %   Labs are reviewed and are pertinent for elevated AST and glucose.  Current Facility-Administered Medications  Medication Dose Route Frequency Provider Last Rate Last Dose  . amoxicillin (AMOXIL) capsule 1,000 mg  1,000 mg Oral Q12H Threasa Beards, MD   1,000 mg at 08/02/13 0945  . diphenhydrAMINE (BENADRYL) capsule 50 mg  50 mg Oral BID Delfin Gant, NP   50 mg at 08/02/13 0749  . doxycycline (VIBRA-TABS) tablet 100 mg  100 mg Oral Q12H Threasa Beards, MD   100 mg at 08/02/13 0945  . elvitegravir-cobicistat-emtricitabine-tenofovir (STRIBILD) 150-150-200-300 MG tablet 1 tablet  1  tablet Oral Q breakfast Mariea Clonts, MD   1 tablet at 08/02/13 0749  . ibuprofen (ADVIL,MOTRIN) tablet 600 mg  600 mg Oral Q8H PRN Arville Lime Schinlever, PA-C      . lamoTRIgine (LAMICTAL) tablet 100 mg  100 mg Oral BID Delfin Gant, NP   100 mg at 08/01/13 1044  . LORazepam (ATIVAN) injection 2 mg  2 mg Intramuscular Q4H Tanna Furry, MD      . LORazepam (ATIVAN) tablet 2 mg  2 mg Oral Q8H PRN Delfin Gant, NP   2 mg at 07/31/13 1832  . multivitamin with minerals tablet 1 tablet  1 tablet Oral Daily Delfin Gant, NP   1 tablet at 08/02/13 0948  . ondansetron (ZOFRAN) tablet 4 mg  4 mg Oral Q8H PRN Arville Lime Schinlever, PA-C      . white petrolatum (VASELINE) gel   Topical PRN Mariea Clonts, MD      . ziprasidone (GEODON) capsule 40 mg  40 mg Oral BID WC Delfin Gant, NP       Current Outpatient Prescriptions  Medication Sig Dispense Refill  . diphenhydrAMINE (BENADRYL) 25 mg capsule Take 50 mg by mouth once.      . Elviteg-Cobicis-Emtricit-Tenof  (STRIBILD PO) Take 1 tablet by mouth daily.        Psychiatric Specialty Exam: Physical Exam  Nursing note and vitals reviewed. Constitutional: He appears well-developed.  Neck: Neck supple.    Review of Systems  Psychiatric/Behavioral: Positive for hallucinations. The patient is nervous/anxious.     Blood pressure 114/85, pulse 103, temperature 97.8 F (36.6 C), temperature source Oral, resp. rate 20, SpO2 99.00%.There is no weight on file to calculate BMI.  General Appearance: Bizarre, Disheveled and Guarded  Eye Contact::  Poor  Speech:  Loud  Volume:  Increased  Mood:  Angry and Irritable  Affect:  Congruent and Labile  Thought Process:  Irrelevant and Tangential  Orientation:  Other:  He is oriented to person and time. He knew he was in [place]. He was unsure if he was in the ED or [facility].   Thought Content:  Paranoid Ideation  Suicidal Thoughts:  No  Homicidal Thoughts:  No  Memory:  Immediate;   Poor Recent;   Poor Remote;   Fair  Judgement:  Poor  Insight:  Shallow  Psychomotor Activity:  Increased and Restlessness  Concentration:  Poor  Recall:  Poor  Fund of Knowledge:Fair  Language: Fair  Akathisia:  No  Handed:  Right  AIMS (if indicated):     Assets:  Resilience  Sleep:      Musculoskeletal: Strength & Muscle Tone: within normal limits Gait & Station: normal Patient leans: N/A  Treatment Plan Summary: Recommend inpatient treatment. Not coughing or complaining of chest pain. Continue current meds. Remains somewhat guarded but not aggressive. Still showing poor insight. Would need stabilization.  Merian Capron MD 08/02/2013 10:47 AM

## 2013-08-02 NOTE — Consult Note (Addendum)
[facility] Medical Consultation  Adam BouchardDavid [name] ZOX:[id number] DOB: 1970/06/10 DOA: 07/30/2013 PCP: No primary provider on file.   Requesting physician: Dr.McManus Date of consultation: 5/11 Reason for consultation: medical consult  Impression/Recommendations  1. Pneumonia -likely community acquired  -without any respiratory distress, afebrile -treat with PO Augmentin for 7 days -Avoid quinolones due to concomitant use of QT prolonging meds -no indication [facility]  2. HIV -continue HAART -CD4 count 380, needs Outpatient follow up for this  3. Bipolar disorder/Mania/Psychosis -per Psychiatry -awaiting Psychiatric facility  4. Tobacco use disorder -advised to quit smoking   HPI: Adam [name] is a 43/M with PMH of Bipolar disorder, PTSD, HIV, Tobacco use, homelessness, [place], presented to the ER 5/8 with Acute Psychosis, Psych was consulted, IVC filled out and seen by Psychiatry, started on Geodon and Lamictal, had a CXR done 5/9 due to cough showed infiltrate, medical consult requested per EDP, started on Amoxil and DOxy then. Pt does report some productive cough, no fevers, no wheezing or dyspnea. He does smoke cigarettes but would not tell me how much.   Review of Systems:  12 system review positive for Cough  Past Medical History  Diagnosis Date  . HIV (human immunodeficiency virus infection)   . ADD (attention deficit disorder)   . Bipolar affective   . PTSD (post-traumatic stress disorder)    Past Surgical History  Procedure Laterality Date  . Hernia repair     Social History:  reports that he has been smoking Cigarettes.  He has been smoking about 0.50 packs per day. He does not have any smokeless tobacco history on file. He reports that he does not drink alcohol or use illicit drugs.  Allergies  Allergen Reactions  . Depakote [Divalproex Sodium] Anaphylaxis and Other (See Comments)    Pt states one of these meds  attacked his white blood cells  . Haldol [Haloperidol Lactate] Anaphylaxis and Other (See Comments)    Pt states one of these meds attacked his white blood cells   Family History  Problem Relation Age of Onset  . Seizures Mother     Prior to Admission medications   Medication Sig Start Date End Date Taking? Authorizing Provider  diphenhydrAMINE (BENADRYL) 25 mg capsule Take 50 mg by mouth once.   Yes Historical Provider, MD  Elviteg-Cobicis-Emtricit-Tenof (STRIBILD PO) Take 1 tablet by mouth daily.   Yes Historical Provider, MD   Physical Exam: Blood pressure 114/85, pulse 103, temperature 97.8 F (36.6 C), temperature source Oral, resp. rate 20, SpO2 99.00%. Filed Vitals:   08/02/13 0542  BP: 114/85  Pulse: 103  Temp: 97.8 F (36.6 C)  Resp: 20     General:  AAOx3  HEENT: PERRLA, EOMI  Cardiovascular: S1S2/RRR  Respiratory: CTAB  Abdomen: soft, Nt, BS present  Skin: no edema c/c  Musculoskeletal: no edema c/c  Psychiatric: manic,angry  Labs on Admission:  Basic Metabolic Panel:  Recent Labs Lab 07/30/13 2245  NA 131*  K 3.6*  CL 92*  CO2 22  GLUCOSE 122*  BUN 18  CREATININE 1.13  CALCIUM 8.8   Liver Function Tests:  Recent Labs Lab 07/30/13 2245  AST 104*  ALT 36  ALKPHOS 81  BILITOT 1.2  PROT 7.9  ALBUMIN 3.5   No results found for this basename: LIPASE, AMYLASE,  in the last 168 hours No results found for this basename: AMMONIA,  in the last 168 hours CBC:  Recent Labs Lab 07/30/13 2245  WBC 11.2*  NEUTROABS 8.4*  HGB 14.0  HCT 39.6  MCV 88.0  PLT 139*   Cardiac Enzymes: No results found for this basename: CKTOTAL, CKMB, CKMBINDEX, TROPONINI,  in the last 168 hours BNP: No components found with this basename: POCBNP,  CBG: No results found for this basename: GLUCAP,  in the last 168 hours  Radiological Exams on Admission: Dg Chest 2 View  07/31/2013   CLINICAL DATA:  Cough  EXAM: CHEST - 2 VIEW  COMPARISON:  None available   FINDINGS: Focal airspace consolidation in the posterolateral left lower lobe. Right lung clear. No definite effusion. Heart size normal. .  Visualized skeletal structures are unremarkable.  IMPRESSION: Focal left lower lobe pneumonia.   Electronically Signed   By: Oley Balmaniel  Hassell M.D.   On: 07/31/2013 13:02    EKG: Independently reviewed. NSR, no acute St T wave changes  Time spent: 40min  Adam [name] [facility] Pager [phone]  If 7PM-7AM, please contact night-coverage [email or web address] Password [facility] 08/02/2013, 11:48 AM

## 2013-08-02 NOTE — Progress Notes (Addendum)
CSW refaxed clinical information [facility] by [facility]. Pt pending inpatient treatment. Pt was taken off the waitlist from previous referral per [place].   Byrd HesselbachKristen Fortino Haag, LCSW [phone]  ED CSW 08/02/2013 1357pm   CS[facility].   [facility] 400 hall bed however at this time no beds available.    Byrd HesselbachKristen Melaney Tellefsen, LCSW [phone]  ED CSW 08/02/2013 1426pm

## 2013-08-02 NOTE — Progress Notes (Signed)
  CARE MANAGEMENT ED NOTE 08/02/2013  Patient:  Adam [name],Adam [name]   Account Number:  [id number]  Date Initiated:  08/02/2013  Documentation initiated by:  Edd ArbourGIBBS,KIMBERLY  Subjective/Objective Assessment:   43 yr old male who states he has united health care coverage but listed in epic with generic medicare advantage No scanned medicaid card in EPIC     Subjective/Objective Assessment Detail:     Action/Plan:   CM porvided pt with a list of united health care in network providers   Action/Plan Detail:   Anticipated DC Date:       Status Recommendation to Physician:   Result of Recommendation:    Other ED Services  Consult Working Plan    DC Planning Services  Other  PCP issues  Outpatient Services - Pt will follow up    Choice offered to / List presented to:            Status of service:  Completed, signed off  ED Comments:   ED Comments Detail:

## 2013-08-02 NOTE — ED Notes (Signed)
Pt refusing his psych meds states "I don't have a psychiatrist or a regular medical doctor here so just give me my regular meds and everything else isn't any of your fucking business."

## 2013-08-02 NOTE — Progress Notes (Signed)
Saw patient twice today, once in morning and once in evening.    Patient was pleasant, conversational and appeared to be very intelligent.  He also appeared to be manipulative in conversation, with a penchant for moving the conversation off an original discussion to some tangent, until the original point was gone.  When his talk switched from inquiry or exchange to opinion, he would become vociferous in his conversation given also to a conspiracy orientation on political topics.  At one point he asked the Chaplain if he was afraid of him.  Chaplain shared that he wasn't, but that he does get energetic at times and it could be understood why people might have a certain "fear" reaction to him.  Chaplain never felt physically in any danger, and the nurse stated he was not physically aggressive, but he could be "mean."  The patient stated he suffered from PTSD.  Inquiry questions by the Chaplain was received with veiled or avoidance responses.  He claims to have a twin brother, and another brother. Nurse also states he claims a sister. He states he was not in the [employer].  States one brother (don't know which) is in [employer].  Though not asked directly, he gives the impression that he is not close with any of his siblings.  He gave very guarded responses to where he lived previously before coming to [place], not revealing any particulars, stating, for example, [place] and nothing more specific.    Both visits were stimulating and pleasant exchanges.  Patient asked a lot of questions of the Chaplain, about his upbringing, his family, his religious background and his perspectives.  Discussion spanned religion, religious history, politics, congressional pay, the [place] war, both [employer] presidents, boudaries, personal responsibility and other similar areas.  He offered no work history or much other personal history.  Patient often asked several questions a second time, as if he had  totally forgotten he asked them or just forgot the response.  He was not able to articulate any particular care plan for him, and nurse stated there was nothing specific so far.    Adam [name], Chaplain Pager: [phone]

## 2013-08-03 MED ORDER — AMOXICILLIN-POT CLAVULANATE 875-125 MG PO TABS: 1.0000 | ORAL_TABLET | Freq: Two times a day (BID) | ORAL | Status: AC

## 2013-08-03 NOTE — Progress Notes (Signed)
[facility] denied from [facility], patient recently at [facility] and psychiatrist recommending [facility].  Pt declined from [facility] due to aggressiveness.  [facility] pending review.    Byrd HesselbachKristen Kyara Boxer, LCSW [phone]  ED CSW 08/03/2013 829am

## 2013-08-03 NOTE — [facility] Suicide Risk Assessment (Signed)
Suicide Risk Assessment  Discharge Assessment     Demographic Factors:  Male, Caucasian, Low socioeconomic status and Living alone  Total Time spent with patient: 45 minutes  Psychiatric Specialty Exam:     Blood pressure 101/60, pulse 71, temperature 98 F (36.7 C), temperature source Oral, resp. rate 18, SpO2 98.00%.There is no weight on file to calculate BMI.  General Appearance: Casual  Eye Contact::  Good  Speech:  Clear and Coherent  Volume:  Normal  Mood:  Euthymic  Affect:  Appropriate  Thought Process:  Coherent  Orientation:  Full (Time, Place, and Person)  Thought Content:  Negative  Suicidal Thoughts:  No  Homicidal Thoughts:  No  Memory:  Immediate;   Good Recent;   Good Remote;   Good  Judgement:  Intact  Insight:  Lacking  Psychomotor Activity:  Normal  Concentration:  Good  Recall:  Good  Fund of Knowledge:Good  Language: Good  Akathisia:  Negative  Handed:  Right  AIMS (if indicated):     Assets:  Communication Skills Leisure Time Resilience  Sleep:       Musculoskeletal: Strength & Muscle Tone: within normal limits Gait & Station: normal Patient leans: N/A   Mental Status Per Nursing Assessment::   On Admission:     Current Mental Status by Physician: NA  Loss Factors: NA  Historical Factors: NA  Risk Reduction Factors:   NA  Continued Clinical Symptoms:  Previous Psychiatric Diagnoses and Treatments  Cognitive Features That Contribute To Risk:  Thought constriction (tunnel vision)    Suicide Risk:  Minimal: No identifiable suicidal ideation.  Patients presenting with no risk factors but with morbid ruminations; may be classified as minimal risk based on the severity of the depressive symptoms  Discharge Diagnoses:   AXIS I:  Psychotic Disorder NOS AXIS II:  Deferred AXIS III:   Past Medical History  Diagnosis Date  . HIV (human immunodeficiency virus infection)   . ADD (attention deficit disorder)   . Bipolar  affective   . PTSD (post-traumatic stress disorder)    AXIS IV:  chronic homelessness AXIS V:  61-70 mild symptoms  Plan Of Care/Follow-up recommendations:  Activity:  resume usual activity Diet:  resume usual diet  Is patient on multiple antipsychotic therapies at discharge:  No   Has Patient had three or more failed trials of antipsychotic monotherapy by history:  No  Recommended Plan for Multiple Antipsychotic Therapies: NA    Adam [name] 08/03/2013, 11:48 AM

## 2013-08-03 NOTE — Consult Note (Signed)
  Psychiatric Specialty Exam: Physical Exam  ROS  Blood pressure 101/60, pulse 71, temperature 98 F (36.7 C), temperature source Oral, resp. rate 18, SpO2 98.00%.There is no weight on file to calculate BMI.  General Appearance: Casual  Eye Contact::  Good  Speech:  Clear and Coherent  Volume:  Normal  Mood:  Euthymic  Affect:  Appropriate  Thought Process:  Coherent  Orientation:  Full (Time, Place, and Person)  Thought Content:  Negative  Suicidal Thoughts:  No  Homicidal Thoughts:  No  Memory:  Immediate;   Good Recent;   Good Remote;   Good  Judgement:  Fair  Insight:  Lacking  Psychomotor Activity:  Normal  Concentration:  Good  Recall:  Good  Akathisia:  Negative  Handed:  Right  AIMS (if indicated):     Assets:  Communication Skills Leisure Time Resilience Talents/Skills  Sleep:   good  Mr Joanna PuffLawver says he [place] the woods [place] 4 [place].  He was picked up by the police for acting strange.  This is his baseline, he in the past has not chosen to follow up but this time he says he will.  He did get pneumonia which is being treated.  He says [facility] [place] and he takes them.  He likes being homeless and is slowly wandering [place] [facility] [place] his choice.  He is not suicidal and wants to be discharged.  His psychotic thinking is not endangering any one else. Plan is to discharge today to be followed outpatient as he chooses.

## 2013-08-03 NOTE — Consult Note (Signed)
  Review of Systems  Constitutional: Negative.   HENT: Negative.   Eyes: Negative.   Respiratory: Positive for shortness of breath.   Cardiovascular: Negative.   Gastrointestinal: Negative.   Genitourinary: Negative.   Musculoskeletal: Negative.   Skin: Negative.   Psychiatric/Behavioral: Negative.    Has HIV but complains of no symptoms

## 2013-08-03 NOTE — Progress Notes (Signed)
Per discussion with psychiatrist, patient psychiatrically stable for dc home. [facility] at 130pm for assessment and medication manaagment at 230pm. No further Clinical Social Work needs, signing off.   Olga CoasterKristen Conchita [place], [place] [phone]  ED CSW 08/03/2013 1053am

## 2013-09-14 ENCOUNTER — Ambulatory Visit (HOSPITAL_COMMUNITY): Payer: Self-pay | Admitting: Psychiatry

## 2015-02-12 IMAGING — CR DG CHEST 2V
2 series · 2 of 2 positions shown · non-contrast
Comparison: None available

CLINICAL DATA: Cough

EXAM:
CHEST - 2 VIEW

[w chest pa]
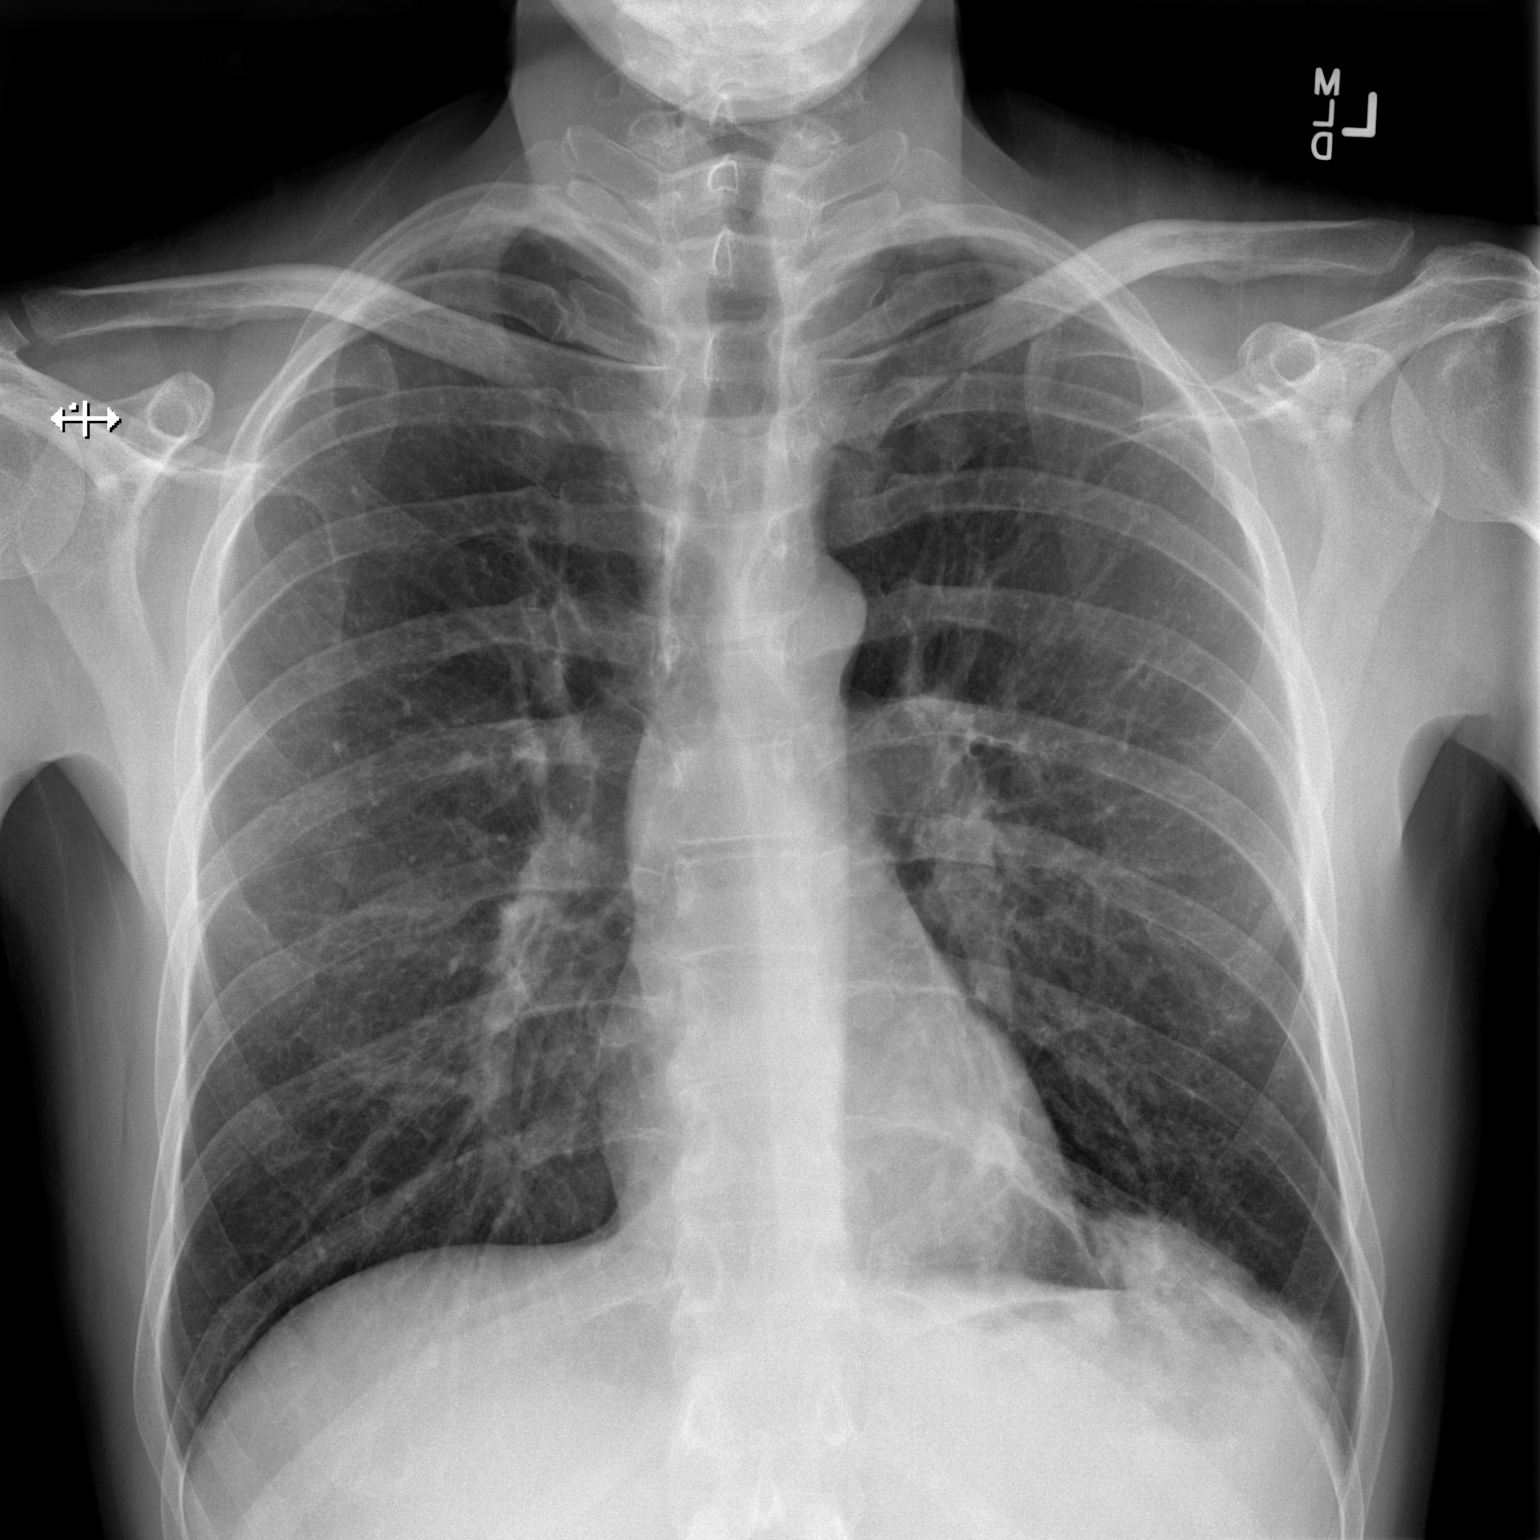

[w chest lat]
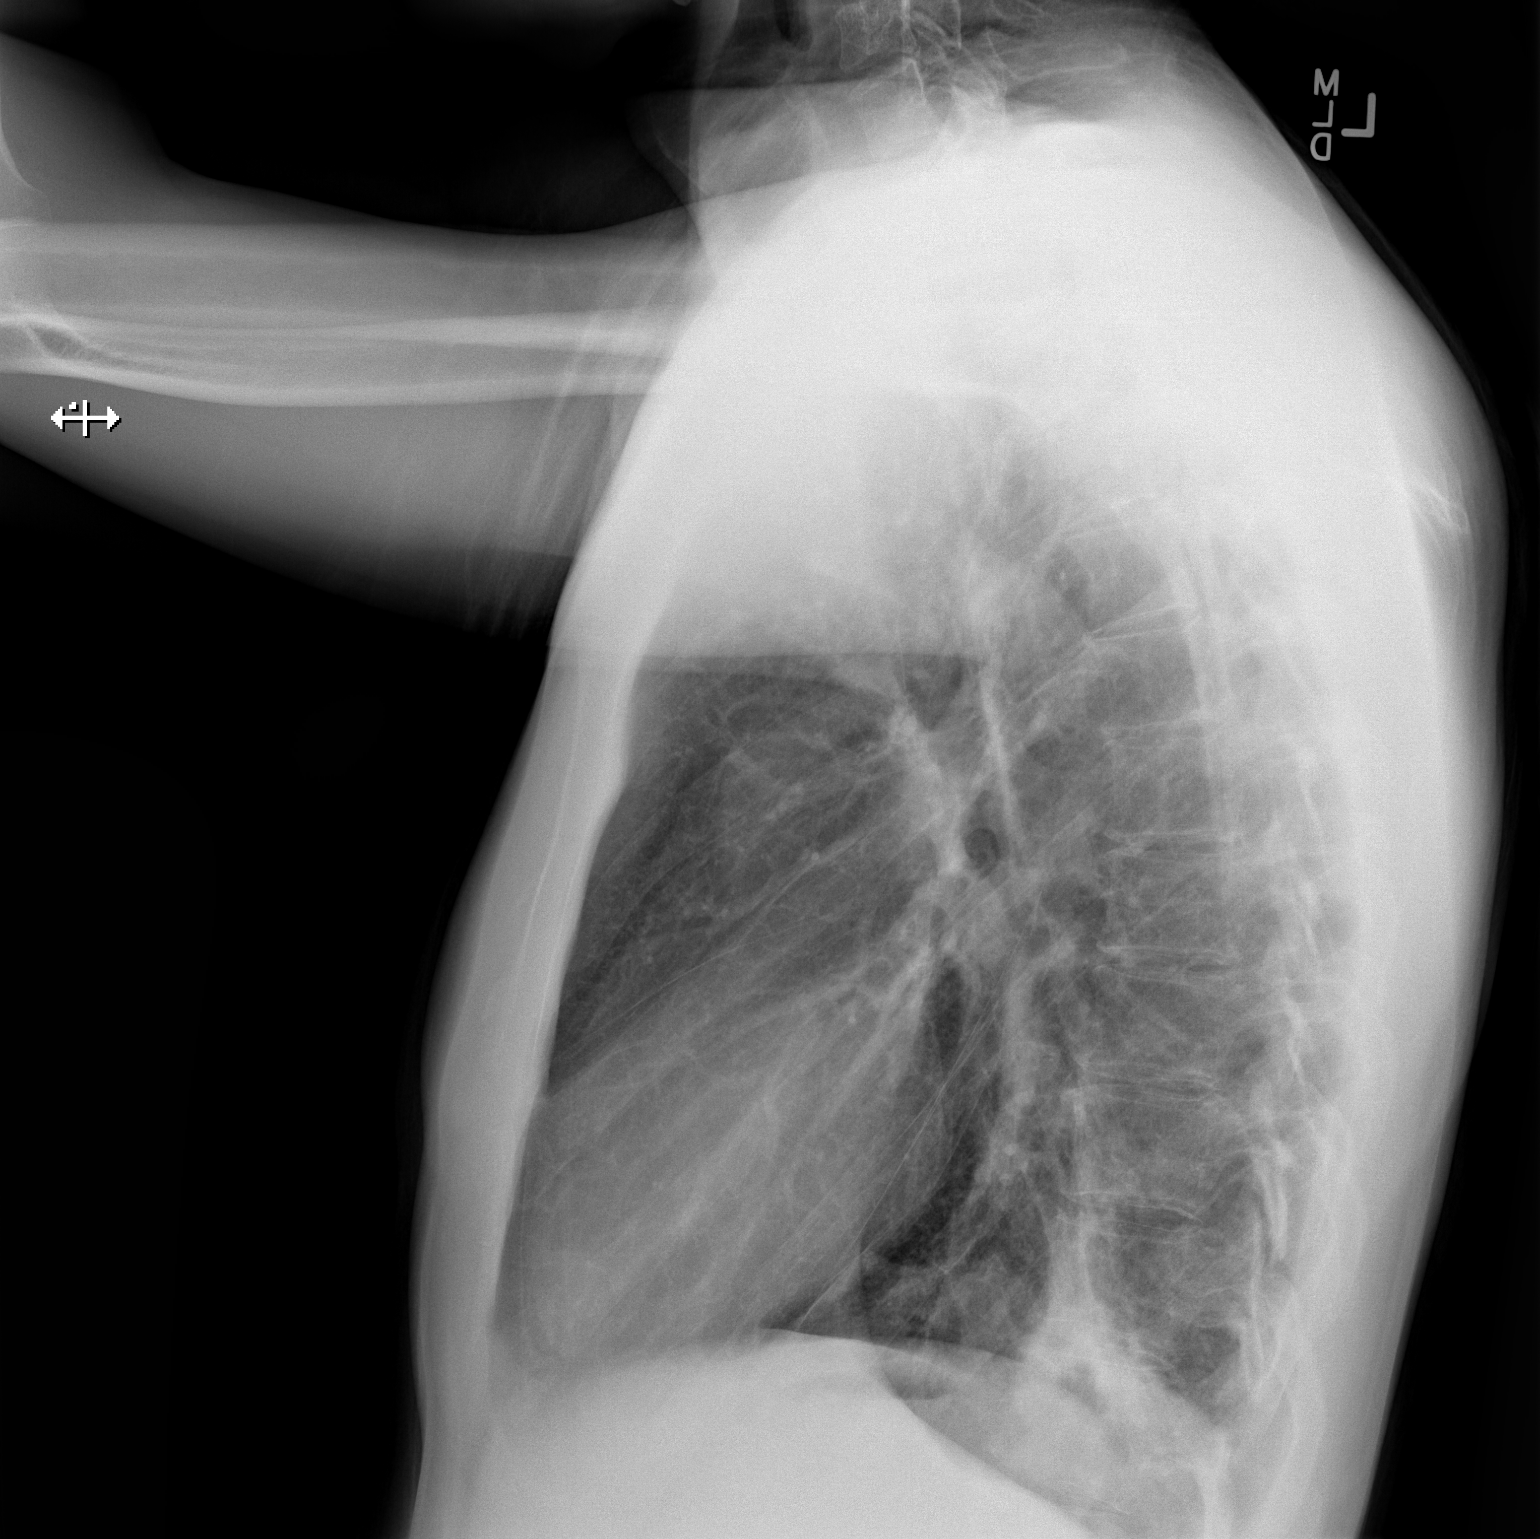

[2 of 2 positions shown; findings below may reference images not displayed]

FINDINGS: Focal airspace consolidation in the posterolateral left lower lobe.
Right lung clear. No definite effusion. Heart size normal. .

Visualized skeletal structures are unremarkable.
IMPRESSION: Focal left lower lobe pneumonia.

## 2019-04-06 ENCOUNTER — Encounter: Admit: 2019-04-06 | Discharge: 2019-04-06 | Payer: MEDICARE

## 2019-08-30 ENCOUNTER — Encounter: Admit: 2019-08-30 | Discharge: 2019-08-30 | Payer: MEDICARE

## 2019-08-31 ENCOUNTER — Encounter: Admit: 2019-08-31 | Discharge: 2019-08-31 | Payer: MEDICARE

## 2019-08-31 ENCOUNTER — Inpatient Hospital Stay
Admit: 2019-08-31 | Discharge: 2019-09-01 | Discharge status: Against Medical Advice | Payer: MEDICARE | Source: Other Acute Inpatient Hospital | Attending: Psychiatry | Admitting: Psychiatry

## 2019-08-31 DIAGNOSIS — F4310 Post-traumatic stress disorder, unspecified: Secondary | ICD-10-CM

## 2019-08-31 DIAGNOSIS — F3112 Bipolar disorder, current episode manic without psychotic features, moderate: Secondary | ICD-10-CM

## 2019-08-31 DIAGNOSIS — F319 Bipolar disorder, unspecified: Secondary | ICD-10-CM

## 2019-08-31 DIAGNOSIS — B20 Human immunodeficiency virus [HIV] disease: Secondary | ICD-10-CM

## 2019-08-31 DIAGNOSIS — J449 Chronic obstructive pulmonary disease, unspecified: Secondary | ICD-10-CM

## 2019-08-31 DIAGNOSIS — F2089 Other schizophrenia: Secondary | ICD-10-CM

## 2019-08-31 DIAGNOSIS — F99 Mental disorder, not otherwise specified: Secondary | ICD-10-CM

## 2019-08-31 MED ORDER — HYDROXYZINE HCL 25 MG PO TAB
25 mg | ORAL | 0 refills | Status: DC | PRN
Start: 2019-08-31 — End: 2019-09-01

## 2019-08-31 MED ORDER — CALCIUM CARBONATE 200 MG CALCIUM (500 MG) PO CHEW
500 mg | ORAL | 0 refills | Status: DC | PRN
Start: 2019-08-31 — End: 2019-09-01

## 2019-08-31 MED ORDER — BICTEGRAV-EMTRICIT-TENOFOV ALA 50-200-25 MG PO TAB
1 | Freq: Every day | ORAL | 0 refills | Status: DC
Start: 2019-08-31 — End: 2019-09-01
  Administered 2019-08-31: 16:00:00 1 via ORAL

## 2019-08-31 MED ORDER — OLANZAPINE 10 MG IM SOLR
5 mg | INTRAMUSCULAR | 0 refills | Status: DC | PRN
Start: 2019-08-31 — End: 2019-09-01

## 2019-08-31 MED ORDER — LURASIDONE 40 MG PO TAB
40 mg | Freq: Every day | ORAL | 0 refills | Status: DC
Start: 2019-08-31 — End: 2019-09-01

## 2019-08-31 MED ORDER — DIPHENHYDRAMINE HCL 50 MG/ML IJ SOLN
50 mg | INTRAMUSCULAR | 0 refills | Status: DC | PRN
Start: 2019-08-31 — End: 2019-09-01

## 2019-08-31 MED ORDER — POLYETHYLENE GLYCOL 3350 17 GRAM PO PWPK
1 | Freq: Every day | ORAL | 0 refills | Status: DC | PRN
Start: 2019-08-31 — End: 2019-09-01

## 2019-08-31 MED ORDER — TRAZODONE 50 MG PO TAB
50 mg | Freq: Every evening | ORAL | 0 refills | Status: DC | PRN
Start: 2019-08-31 — End: 2019-09-01

## 2019-08-31 MED ORDER — ACETAMINOPHEN 325 MG PO TAB
650 mg | ORAL | 0 refills | Status: DC | PRN
Start: 2019-08-31 — End: 2019-09-01

## 2019-08-31 MED ORDER — OLANZAPINE 5 MG PO TBDI
5 mg | ORAL | 0 refills | Status: DC | PRN
Start: 2019-08-31 — End: 2019-09-01

## 2019-08-31 MED ORDER — DIPHENHYDRAMINE HCL 25 MG PO CAP
50 mg | ORAL | 0 refills | Status: DC | PRN
Start: 2019-08-31 — End: 2019-09-01

## 2019-08-31 MED ORDER — LATUDA 60 MG PO TAB
60 mg | ORAL_TABLET | Freq: Every day | ORAL | 0 refills | Status: DC
Start: 2019-08-31 — End: 2019-08-31

## 2019-08-31 NOTE — Behavioral Health Treatment Team
TREATMENT TEAM NOTE  Name: Kevin Oliver          MRN: [id number]              DOB: Jul 25, 1970          Age: 49 y.o.  Admission Date: 08/31/2019                 Attendees:   Psychiatrist: Cinda Quest, MD  Nurse: Steward Ros, RN  Pharmacist: Hazle Quant, PharmD  Therapist: Lodema Hong, LCMFT, LCAC  Therapist: Netty Starring, [facility]  Case Manager: Elvina Sidle, LMSW  Case Manager: Eden Lathe, [facility]  Case Manager: Ival Bible, LMSW  Recreation Therapist: Prentice Docker, CTRS  Music Therapist: Berle Mull, MT-BC  Utilization Review: Keitha Butte, RN      Significant Points Discussed:   New admission  INVOLUNTARY  Brought in by [employer]  Off his medications  Psychosis  Calm this morning  Went to OSH in 2016    Treatment Plan Discussion:   Doctor will follow    Projected Discharge Date:

## 2019-08-31 NOTE — Progress Notes
PSYCHIATRIC NURSING ADMISSION ASSESSMENT     NAME:Raeden Robet Crutchfield             MRN: [id number]             DOB:[birth year]          AGE: 49 y.o.  ADMISSION DATE: 08/31/2019             DAYS ADMITTED: LOS: 0 days    Patient presents to the unit from: [facility]    Patient states the reason for admission is  I need to be able to get more sleep    Walks from sally port after [employer] assessment with a steady gait. Wearing two hospital gowns and bare feet.   skin assessment and change into SH sweats-lips are cracked, bruising to both arms from IVs and blood draws. Declined shower. Hygiene wnl. Snack and drink offered and accepted    Cooperative with assessment, short answers, denies any SI, HI or AVH. Is not expressing any psychosis at this time. Mood is flat and calm. States that he has not been taking any prescribed medications because he is trying a more holistic approach with supplements and natural remedies. Has not been taking antivirals  C[place] says that he was lost in the [place] and could not find the car he was driving when he was picked up by the police. Acknowledges that he was angry with them. He feels that he has not been getting enough sleep since he started a new job. Unsure if medications can help him sleep better.    Denies SA. States that he smokes cigarettes that he rolls himself but does not want a nicotine patch at this time.    Placed on 15 minute checks for safety. COVID neg. Escorted to unit by staff and [employer]      Mental Status Exam  Legal Status: Involuntary Admission  General Appearance: Avoids Eye Contact, Disheveled  Mood / Affect: Flat Affect, Congruent  Speech: Normal(short answers)  Content Of Thought: Guarded  Motor Activity: Normal  Flow Of Thought: Normal  Insight / Judgment: Poor Judgment, Poor Insight  Behavior: Aloof, Follows Commands, Cooperative  Patient Strengths: Exercising self-direction, Access to housing/residential stability, Knowledge of medications    [employer]  Have you ever felt you ought to cut down on your drinking or drug use?: No  Have people annoyed you by criticizing your drinking or drug use?: No  Have you felt bad or guilty about your drinking or drug use?: No  Have you ever had a drink or used drugs first thing in the morning to steady your nerves or to get rid of a hangover (eye-opener)?: No  Total Score: 0    AUDIT-C  How Often Drink w/ Alcohol?: Never  # Drinks w/ Alcohol In Typical Day?: 0 drinks  How Often 6+ Drinks Per Occasion?: Never  AUDIT-C Total Score: 0    Contraband/Body Checks  Patient Searched: Yes  Belongings Searched: Yes  Head Lice Check: Yes         Do you have access to firearms/weapons? no  If yes, who will secure them prior to discharge? n/a    Jeanella Anton, RN  08/31/2019

## 2019-08-31 NOTE — Group Note
Name: Kevin Oliver        MRN: [id number]          DOB: 16-Oct-1970          Age: 49 y.o.  Admission Date: 08/31/2019             LOS: 0 days      Group Topic: BH Wellness Plan  Group Date: 08/31/2019  Start Time: 0945  End Time: 1030  Facilitators: Berle Mull          Number of Participants: 7  Group Focus: acceptance, coping skills, feeling awareness/expression, goals/reality orientation, reminiscence, and self-awareness  Treatment Modality: Music Therapy  Interventions utilized were exploration, other lyric analysis and active listening, reminiscence, story telling, and support  Purpose: enhance coping skills, express feelings, and reinforce self-care      Music Therapist provided the song It's my life and prompted patients to discuss how they related to the song and what they got out of it. Patients were then provided a writing prompt stating, Remember how far you have come! Write about who you were/life situation 1 year ago. What things have you overcome. Patients then shared if they chose to. Music Therapist then provided the song it's not over and prompted patients to discuss the song. Patients were then provided another prompt stating, Write a letter for yourself for difficult times you may have after discharge. What do you need to remind yourself to keep going? Patients were prompted to put both prompts in an envelope for themselves to have after they discharge.     Name: Kevin Oliver Date of Birth: October 07, 1970   MR: [id number]      Level of Participation: patient not present for group     Plan: to continue treatment

## 2019-08-31 NOTE — H&P (View-Only)
PSYCHIATRY H&P  Name: Kevin Oliver        MRN: [id number]          DOB: [birth year]          Age: 49 y.o.  Admission Date: 08/31/2019             LOS: 0 days      Service: SH Adult Psych B                                             PCP  No Pcp, Na    ASSESSMENT & DIAGNOSIS  Active Problems:    Bipolar affective disorder, current episode manic (HCC)      PLAN  1. Admit to [facility].  2. Internal Medicine Consult for Medical Evaluation and Co-Management of conditions as needed  3. Patient will undergo a medication evaluation and adjustment as necessary.  1. Initiate Latuda 40 mg daily with dinner. If not covered by insurance will consider replacing with Zyprexa  4. Participate in a program schedule including group and individual therapy.  5. The patient will be in a structured environment to maintain safety  6. Will obtain collateral information as needed.  7. Labs reviewed and incorporated into plan. Metabolic labs ordered  8. EKG sinus bradycardia with QTc of 416  9. Anticipate discharge on 6/10    Discharge Plan  Patient will exhibit stability of their symptoms. Will work a Paramedic to develop a Water engineer. Estimated length of stay is 2-3 days.    _____________________________________________________  CHIEF CONCERN  sleep deprivation    Sources of Information  Patient, Chart Review, Intake Assessment    HISTORY OF PRESENT ILLNESS  Kevin Oliver is a 49 y.o. male with a history of bipolar disorder, PTSD, HIV, emphysema admitted from [facility] center where he initially presented on 08/29/19 after he stole a car and was psychotic when police arrested him. Per report he was in 4 point restraints in the emergency department but has been increasingly calm sicne being given medication including Risperdal 0.5 mg Ativan 2 mg x2, Bendaryl 50 mg IM, Geodon 20 mg IM.  He reports prior to coming in, he lost his car and spent 11 hours trying to find his car. He then got lost in the car.  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 He notes depression has not been bad in some time, but in the past he struggled with poor sleep, appetite, energy, concentration, anhedonia, SI.  He reports periods where he is overly active and creative, he will writer more, and he does not sleep for days. States his thinking becomes different, he becomes more daring, and he takes risks that he would not necessarily take. He talks and thinks faster during these periods.  Denies psychosis.  Anxiety is intermittent, denies feeling like it is excessive.  Denies HI.  He reports wanting to leave in a couple of days to get back to work but agrees to stay to get started on a mood stabilizing medication.      Environmental Risks       PAST PSYCHIATRIC HISTORY  Hospitalizations-a few, most recently [place] Oct 2020 for SI  Previously diagnosed with Bipolar disorder, diagnosed in his 20's  Attempts- denies  Self harm-denies  Medication trials-  I haven't taken much  Recalls being on Seroquel (increased sedation during the day), Depakote (allergic-caused tardive dyskinesia), Zyprexa, Abilify, Risperdal (doesn't help), Zoloft (made things worse), does not recall response to Prozac, Haldol (dystonia)  Current meds- denies    FAMILY PSYCHIATRIC HISTORY  Denies    SUBSTANCE USE  Tobacco- rolls his own tobacco and smokes 1/2 PPD, denies nicotine replacement  Cannabis-denies  Cocaine-denies  Methamphetamines-denies  Denies illicit drugs    Social History     Substance and Sexual Activity   Drug Use Not Currently   ? Types: Marijuana       Abuse History      SOCIAL HISTORY  Lives in [place] with his mom  Single, no children  Employed as a Academic librarian for [employer]- has current charges for trespassing  Education: some college, studied Investment banker, corporate, pre-law      Social History     Tobacco Use   ? Smoking status: Current Every Day Smoker     Packs/day: 1.00     Years: 35.00     Pack years: 35.00     Types: Cigarettes   ? Smokeless tobacco: Never Used   Substance Use Topics   ? Alcohol use: Not Currently   ? Drug use: Not Currently     Types: Marijuana         FAMILY MEDICAL HISTORY  History reviewed. No pertinent family history.    PAST MEDICAL AND SURGICAL HISTORY  Medical History:   Diagnosis Date   ? Bipolar 1 disorder (HCC)    ? COPD (chronic obstructive pulmonary disease) (HCC)     hx of emphysema   ? HIV (human immunodeficiency virus infection) (HCC)    ? Psychiatric illness    ? PTSD (post-traumatic stress disorder)      History reviewed. No pertinent surgical history.    Home Medications  Medications Prior to Admission   Medication Sig Dispense Refill Last Dose   ? bictegravir-emtricitabine-tenofovir ala (BIKTARVY) 50-200-25 mg tablet Take 1 tablet by mouth daily.          Hospital Medications  Scheduled Meds:bictegravir-emtricitabine-tenofovir ala (BIKTARVY) 50-200-25 mg tablet 1 tablet, 1 tablet, Oral, QDAY w/breakfast  lurasidone (LATUDA) tablet 40 mg, 40 mg, Oral, QDAY w/dinner    Continuous Infusions:  PRN and Respiratory Meds:acetaminophen Q6H PRN, calcium carbonate Q2H PRN, diphenhydrAMINE Q6H PRN **OR** diphenhydrAMINE Q6H PRN, hydrOXYzine Q6H PRN, OLANZapine Q6H PRN, OLANZapine Q6H PRN, polyethylene glycol 3350 QDAY PRN, traZODone QHS PRN      Allergies  Haldol [haloperidol lactate], Depakote [divalproex], and Risperidone    REVIEW OF SYSTEMS    Review of Systems   Constitutional: Negative for fever.   HENT: Negative for congestion.    Eyes: Negative for visual disturbance.   Respiratory: Negative for shortness of breath.    Cardiovascular: Negative for chest pain.   Gastrointestinal: Negative for abdominal pain.   Endocrine: Negative for polyuria.   Genitourinary: Negative for dysuria.   Musculoskeletal: Negative for back pain.   Skin: Negative for wound.   Neurological: Negative for headaches. ______________________________________________________________  OBJECTIVE     Vital Signs: Last Filed In 24 Hours                Vital Signs: 24 Hour Range   BP: 126/68 (06/08 0801)  Temp: 36.4 ?C (97.6 ?F) (06/08 0801)  Pulse: 84 (06/08 0801)  Respirations: 18 PER MINUTE (06/08 0801)  SpO2: 98 % (06/08 0801)  Height: 190.5 cm (75) (  06/08 0558) BP: (126-133)/(68-94)   Temp:  [36.2 ?C (97.2 ?F)-36.4 ?C (97.6 ?F)]   Pulse:  [69-84]   Respirations:  [16 PER MINUTE-18 PER MINUTE]   SpO2:  [98 %]    Intensity Pain Scale (Self Report): (not recorded)    Height: 190.5 cm (75)  Weight: 95.3 kg (210 lb)      MENTAL STATUS EXAMINATION    ? General/Constitutional: 49 yo male, appears stated age, no acute distress, average build, dressed appropriately, in fair grooming/hygiene  ? Behavior: Calm, Cooperative  ? Eye Contact: Appropriate and maintained  ? Speech: Normal rate, rhythm, tone and volume  ? Thought Process: Linear and goal directed  ? Thought Content: Denied Suicidal Thoughts. Denied Homicidal Thoughts. Denied Self Harm. No delusions or paranoia noted.  ? Perception: Denied Auditory Hallucinations and denied Visual Hallucinations. No illusions noted  ? Associations: Intact and appropriate for age  ? Mood: better after sleeping  ? Affect: calm, slightly restricted  ? Insight/Judgement: fair/poor    ? Orientation: Alert and Oriented to person, place, time and situation  ? Recent and remote memory: Grossly Intact  ? Attention span and concentration: Good  ? Language: Fluent, Spontaneous  ? Fund of knowledge and vocabulary: Appears Average  ? Motor: No tics or tremors noted. Gait is WNL.      Lab/Radiology/Other Diagnostic Tests:  No visits with results within 30 Day(s) from this visit.   Latest known visit with results is:   Admission on 11/06/2014, Discharged on 11/08/2014   Component Date Value   ? White Blood Cells 11/06/2014 6.5    ? RBC 11/06/2014 4.74    ? Hemoglobin 11/06/2014 14.4    ? Hematocrit 11/06/2014 43.0    ? MCV 11/06/2014 90.8    ? [facility] 11/06/2014 30.4    ? MCHC 11/06/2014 33.5    ? RDW 11/06/2014 14.0    ? Platelet Count 11/06/2014 177    ? MPV 11/06/2014 8.4    ? Neutrophils 11/06/2014 53    ? Lymphocytes 11/06/2014 36    ? Monocytes 11/06/2014 8    ? Eosinophils 11/06/2014 3    ? Basophils 11/06/2014 0    ? Absolute Neutrophil Count 11/06/2014 3.40    ? Absolute Lymph Count 11/06/2014 2.40    ? Absolute Monocyte Count 11/06/2014 0.50    ? Absolute Eosinophil Count 11/06/2014 0.20    ? Absolute Basophil Count 11/06/2014 0.00    ? Sodium 11/06/2014 138    ? Potassium 11/06/2014 3.9    ? Chloride 11/06/2014 109    ? Glucose 11/06/2014 126*   ? Blood Urea Nitrogen 11/06/2014 13    ? Creatinine 11/06/2014 0.99    ? Calcium 11/06/2014 9.4    ? Total Protein 11/06/2014 7.5    ? Total Bilirubin 11/06/2014 0.7    ? Albumin 11/06/2014 4.0    ? Alk Phosphatase 11/06/2014 78    ? AST (SGOT) 11/06/2014 35    ? CO2 11/06/2014 25    ? ALT (SGPT) 11/06/2014 13    ? Anion Gap 11/06/2014 4    ? eGFR Non African American 11/06/2014 >60    ? eGFR African American 11/06/2014 >60    ? WBCs,UA 11/06/2014 5-10    ? RBCs,UA 11/06/2014 0-2    ? Color,UA 11/06/2014 YELLOW    ? Turbidity,UA 11/06/2014 1+*   ? Specific Gravity-Urine 11/06/2014 1.016    ? pH,UA 11/06/2014 7.0    ? Protein,UA 11/06/2014 NEG    ?  Glucose,UA 11/06/2014 NEG    ? Marguerite Olea 11/06/2014 NEG    ? Bilirubin,UA 11/06/2014 NEG    ? Blood,UA 11/06/2014 NEG    ? Urobilinogen,UA 11/06/2014 INCREASED*   ? Nitrite,UA 11/06/2014 NEG    ? Leukocytes,UA 11/06/2014 1+*   ? Urine Ascorbic Acid, UA 11/06/2014 NEG    ? INR 11/06/2014 1.0    ? Creatine Kinase 11/06/2014 648*   ? Alcohol 11/06/2014 <10    ? Acetaminophen 11/06/2014 <10.0    ? Salicylate 11/06/2014 <2.5    ? Amphetamines 11/06/2014 NEG    ? Barbiturates,Urine 11/06/2014 NEG    ? Benzodiazepines 11/06/2014 NEG    ? THC 11/06/2014 POS*   ? Cocaine-Urine 11/06/2014 NEG    ? Opiates-Urine 11/06/2014 NEG    ? Phencyclidine (PCP) 11/06/2014 NEG    ? Magnesium 11/06/2014 2.2    ? Phosphorus 11/06/2014 3.5    ? UA Reflex Culture 11/06/2014 LAB LABEL      ______________________________________________________________  Meryl Crutch, MD

## 2019-08-31 NOTE — Group Note
Name: Yukio Bisping        MRN: [id number]          DOB: 1970/07/29          Age: 49 y.o.  Admission Date: 08/31/2019             LOS: 0 days      Group Topic: BH Positive Problem Solving  Group Date: 08/31/2019  Start Time: 1230  End Time: 1330  Facilitators: Prentice Docker      Number of Participants: 7  Group Focus: coping skills, feeling awareness/expression, problem solving, and self-awareness  Treatment Modality: Recreation Therapy  Interventions utilized were exploration, group exercise, patient education, problem solving, and support  Purpose: enhance coping skills, explore maladaptive thinking, express feelings, and increase insight    The recreation therapist provided the group with instructions and expectations to be followed throughout the group.  Group members were instructed to create a scribble on a piece of paper. Recreation therapist then collected and redistributed the scribbles randomly to participants. Patients were then each given a card with six options of things patients could attempt to make their scribble into. Patients then tried to guess each others' drawings. Therapist led group discussion regarding how patients felt about the game and how they approached it. Therapist challenged patients to compare how they handled the challenges in the art activity to how they handle the challenges they face in everyday life. Group discussed positive ways to face their problems.           Name: Gerber Date of Birth: 07/30/1970   MR: [id number]      Level of Participation: patient not present for group     Plan: to continue treatment

## 2019-08-31 NOTE — Consults
Internal Medicine Initial Consult Note      Admission Date: 08/31/2019                                                LOS: 0 days    Reason for Consult: Medical Management     Consult type: Recommendations with Orders     Assessment  Active Problems:    * No active hospital problems. *    49 y.o. male with a PMH of HIV, Bipolar disorder, PTSD, tobacco usead[facility] following medical issues:  1. Psychosis   2. Bipolar disorder  3. HIV. PTA Biktarvy. Follows with Dr. Steele Berg in [place]. Last labs 08/19/19 with undetected viral load and CD4 absolute 902, % 36.   4. Tobacco use. 1/2 ppd hx x 21 years.   5. Emphysema   6. Elevated CK. Improved with IVF from 824-> 672.  7. Hypokalemia. K 3.2 on OSH. Treated with 40 mew Po K.     Recommendations:  - Called Dr. Noah Charon office and patient was seen 08/19/19. He does not need to follow up with them for 6 months. He needs to call his pharmacy and request for refill if he is out of Biktarvy at home. Will resume here and instruct patient to request refill.   - Ensure hydration, bowel regimen if needed  - DVT prophylaxis if non-ambulatory  - Records from previous encounters, where available, have been reviewed.   - Radiology, imaging, labs, where available, were reviewed  - Continued admission to inpatient psych. Will defer to primary team on psychiatric treatment and pain management    The patient is admitted to inpatient psychiatric unit.  The inpatient psychiatric team is following.  Internal medicine team was consulted for medical evaluation.  I reviewed patient medical history, vitals, medications, labs and available medical records  .  Thanks for the consult , the patient is currently medically stable we will be happy to see the patient again if needed     Internal medicine can be reached out by Looking up [facility] on Voalte or Cureatr app for direct app communication    or by paging [phone]      Jerold Coombe) Jettie Booze, APRN-NP  Pager [phone] @Voalte   ______________________________________________________________________    History of Present Illness: Alyaan Ocasio is a 49 y.o. male admitted to [facility] for psychosis. Patient reports being really busy and not sleeping the last few days prior to admission. Patient reports he was admitted to [facility] in October for a few weeks for similar symptoms. Patient was very vague during interview and when asked why he was at [facility] he was unable to tell me. Per chart review patient was arrested for stealing a car and taken to [facility] for [facility]. He was in 4 point restraints and required chemical sedation with Risperdal, ativan, benadryl, and geodon. Today patient reports feeling overall well. He denies any SI. He denies fevers, chills, chest pain, cough, abdominal pain, nausea, or vomiting. Tolerating PO intake. Denies urinary symptoms. Denies diarrhea or constipation. Reports compliance with HIV medication up until running out about 1 week ago. He endorses ongoing difficulty sleeping.     Medical History:   Diagnosis Date   ? Bipolar 1 disorder (HCC)    ? COPD (chronic obstructive pulmonary disease) (HCC)  hx of emphysema   ? HIV (human immunodeficiency virus infection) (HCC)    ? Psychiatric illness    ? PTSD (post-traumatic stress disorder)      History reviewed. No pertinent surgical history.  Social History     Socioeconomic History   ? Marital status: Single     Spouse name: Not on file   ? Number of children: Not on file   ? Years of education: Not on file   ? Highest education level: Not on file   Occupational History   ? Academic librarian at [employer]    Tobacco Use   ? Smoking status: Current Every Day Smoker     Packs/day: 1.00     Years: 35.00     Pack years: 35.00     Types: Cigarettes   ? Smokeless tobacco: Never Used   Substance and Sexual Activity   ? Alcohol use: Not Currently   ? Drug use: Not Currently     Types: Marijuana   ? Sexual activity: Not on file Other Topics Concern   ? Not on file   Social History Narrative   ? Not on file     Social Determinants of Health     Financial Resource Strain:    ? Difficulty of Paying Living Expenses:    Food Insecurity:    ? Worried About Programme researcher, broadcasting/film/video in the Last Year:    ? Barista in the Last Year:    Transportation Needs:    ? Freight forwarder (Medical):    ? Lack of Transportation (Non-Medical):    Physical Activity:    ? Days of Exercise per Week:    ? Minutes of Exercise per Session:    Stress:    ? Feeling of Stress :    Social Connections:    ? Frequency of Communication with Friends and Family:    ? Frequency of Social Gatherings with Friends and Family:    ? Attends Religious Services:    ? Active Member of Clubs or Organizations:    ? Attends Banker Meetings:    ? Marital Status:    Intimate Partner Violence:    ? Fear of Current or Ex-Partner:    ? Emotionally Abused:    ? Physically Abused:    ? Sexually Abused:      History reviewed. No pertinent family history.    Allergies:  Haldol [haloperidol lactate], Depakote [divalproex], and Risperidone    Scheduled Meds: Continuous Infusions:  PRN and Respiratory Meds:acetaminophen Q6H PRN, calcium carbonate Q2H PRN, diphenhydrAMINE Q6H PRN **OR** diphenhydrAMINE Q6H PRN, hydrOXYzine Q6H PRN, OLANZapine Q6H PRN, OLANZapine Q6H PRN, polyethylene glycol 3350 QDAY PRN    Review of Systems:  A 14 point review of systems was negative except for: difficulty sleeping     Vital Signs:  Last Filed in 24 hours Vital Signs:  24 hour Range    BP: 126/68 (06/08 0801)  Temp: 36.4 ?C (97.6 ?F) (06/08 0801)  Pulse: 84 (06/08 0801)  Respirations: 18 PER MINUTE (06/08 0801)  SpO2: 98 % (06/08 0801)  Height: 190.5 cm (75) (06/08 0558) BP: (126-133)/(68-94)   Temp:  [36.2 ?C (97.2 ?F)-36.4 ?C (97.6 ?F)]   Pulse:  [69-84]   Respirations:  [16 PER MINUTE-18 PER MINUTE]   SpO2:  [98 %]      Physical Exam:  Gen: alert and oriented, cooperative,  no distress Head: Normocephalic, without obvious abnormality, atraumatic  Eyes: conjunctivae/corneas clear. PERRL, EOM's intact  Throat: Lips, mucosa, and tongue normal. Teeth and gums normal   Neck: supple, symmetrical, no JVD  Lungs: clear to auscultation bilaterally, no wheezes, rales, rhonchi   Heart: regular rate and rhythm, S1, S2 normal, no murmur, click, rub or gallop   Abdomen: soft, non-tender. Bowel sounds normal. No masses,  non-distended   Extremities: pulses palpable, no pedal edema or skin lesions   Neurologic: Grossly normal  Lymph nodes: Cervical, supraclavicular nodes normal.    Lab Review  OSH labs reviewed: CK 824-> 672, K 3.2, CBC unremarkable    Point of Care Testing  (Last 24 hours)       Radiology and other Diagnostics Review:    Pertinent radiology reviewed., EKG Reviewed  OSH EKG SB, no ST elevation or depression  OSH CT head without acute intracranial process     Marcene Brawn, APRN-NP  Available on Voalte or Cureatr app under [facility]    pager [phone]

## 2019-08-31 NOTE — Care Plan
Problem: Health Maintenance - Impaired  Goal: Able to perfom ADL's  Outcome: Goal Ongoing  Flowsheets (Taken 08/31/2019 0654)  Able to perform ADLs: Assess for pain  Goal: Establish therapeutic relationships  Outcome: Goal Ongoing  Flowsheets (Taken 08/31/2019 0654)  Establish therapeutic relationships:   Encourage expressions of feelings   Involve patient in decision-making process   Assist patient in setting realistic expectations     Problem: Thought Process - Altered  Goal: Demonstration of organized thought processes  Outcome: Goal Ongoing  Flowsheets (Taken 08/31/2019 0654)  Demonstration of organized thought processes:   Assess disturbed thought process signs and symptoms   Provide environmental regulation - agitation   Assess in reality orientation   Assess thought process   Assess cognitive ability   Assess risk for impaired cognition     Problem: Cognitive-Perceptual Pattern - Impaired  Goal: Demonstration of organized thought processes  Outcome: Goal Ongoing  Flowsheets (Taken 08/31/2019 0654)  Demonstration of organized thought process:   Assess hallucination signs and symptoms   Assess disturbed thought process signs and symptoms   Assess abnormal thought content signs and symptoms   Regulation of the environment
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
## 2019-08-31 NOTE — Group Note
Name: Kevin Oliver        MRN: [id number]          DOB: 1970-12-05          Age: 49 y.o.  Admission Date: 08/31/2019             LOS: 0 days      Group Topic: BH Internal/External Locus of Control  Group Date: 08/31/2019  Start Time: 0900  End Time: 0945  Facilitators: Prentice Docker      Number of Participants: 8  Group Focus: coping skills, feeling awareness/expression, problem solving, self-awareness, and self-esteem  Treatment Modality: Recreation Therapy  Interventions utilized were exploration, group exercise, problem solving, and support  Purpose: enhance coping skills, express feelings, and increase insight    The recreation therapist provided the group with instructions and expectations to be followed throughout the group. The therapist played a game with patients using mini dodge balls and beanbags in which they had to work together to keep multiple balls in the air at the same time. Patients then discussed what they could and couldn't control during the game. Therapist then led a group discussion about locus of control and similarities between what patients could and couldn't control in the game and in real life. Patients then created a list of what they could and could not control and how focusing on what you can control can be a useful coping skill.           Name: Kevin Oliver Date of Birth: April 05, 1970   MR: [id number]      Level of Participation: patient not present for group     Plan: to continue treatment
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
## 2019-08-31 NOTE — Care Plan
Problem: Discharge Planning  Goal: Knowledge regarding plan of care  Outcome: Goal Ongoing  Flowsheets (Taken 08/31/2019 1036)  Knowledge regarding plan of care:   Provide admission education to parent/caregiver   Provide procedural and treatment education   Provide plan of care education   Provide medication management education   Provide infection prevention education     Problem: Health Maintenance - Impaired  Goal: Able to perfom ADL's  Outcome: Goal Ongoing  Flowsheets (Taken 08/31/2019 1036)  Able to perform ADLs:   Assess sleep/rest   Assess for pain   Assess ADL ability  Goal: Establish therapeutic relationships  Outcome: Goal Ongoing  Flowsheets (Taken 08/31/2019 0654 by Carlyle Basques, RN)  Establish therapeutic relationships:   Encourage expressions of feelings   Involve patient in decision-making process   Assist patient in setting realistic expectations  Goal: Knowledge of health maintenance  Outcome: Goal Ongoing  Flowsheets (Taken 08/31/2019 1036)  Knowledge of health maintenance:   Provide health maintenance education   Provide lifestyle changes education   Provide nutritional requirements education     Problem: Mood - Altered  Goal: Knowledge of Altered Mood  Outcome: Goal Ongoing  Flowsheets (Taken 08/31/2019 1036)  Knowledge of altered mood:   Provide psychosocial intervention education   Provide relaxation techniques education     Problem: Thought Process - Altered  Goal: Demonstration of organized thought processes  Outcome: Goal Ongoing  Flowsheets (Taken 08/31/2019 0654 by Carlyle Basques, RN)  Demonstration of organized thought processes:   Assess disturbed thought process signs and symptoms   Provide environmental regulation - agitation   Assess in reality orientation   Assess thought process   Assess cognitive ability   Assess risk for impaired cognition     Problem: Transition Readiness  Goal: Knowledge of transition readiness  Outcome: Goal Ongoing  Flowsheets (Taken 08/31/2019 1036)  Knowledge of transition readiness:   Provide community resource education   Provide medication management education     Problem: Cognitive-Perceptual Pattern - Impaired  Goal: Demonstration of organized thought processes  Outcome: Goal Ongoing  Flowsheets (Taken 08/31/2019 0654 by Carlyle Basques, RN)  Demonstration of organized thought process:   Assess hallucination signs and symptoms   Assess disturbed thought process signs and symptoms   Assess abnormal thought content signs and symptoms   Regulation of the environment  Goal: Knowledge of prescribed medication  Outcome: Goal Ongoing  Flowsheets (Taken 08/31/2019 1036)  Knowledge of prescribed medication:   Provide prescribed medication education   Adherence to medication schedule

## 2019-08-31 NOTE — Progress Notes
Eta 0530

## 2019-08-31 NOTE — Behavioral Health Treatment Team
MENTAL STATUS EXAM    Mental Status Exam  Legal Status: Involuntary Admission  General Appearance: Expressionless, Disheveled  Mood / Affect: Flat Affect  Speech: Normal  Content Of Thought: Guarded  Motor Activity: Normal  Flow Of Thought: Normal  Insight / Judgment: Fair Insight, Fair Judgment  Behavior: Withdrawn, Isolative  Patient Strengths: Exercising self-direction  Pt was sitting in the cafeteria at this a.m. assessment.  Pt presents with flat affect and lack of expression.  Pt is slow to answer assessment questions.  Pt denies anxiety and depression.  Pt also denies SI/HI, pain  and AH/VH.  Pt reports he just wants to sleep, I've not slept in a week.  Pt isolated/slept in his room most of the day.  Pt request to leave D/T work tomorrow.  Will continue to monitor for safety and any changes.

## 2019-08-31 NOTE — Behavioral Health Treatment Team
Name: Kevin Oliver        MRN: [id number]          DOB: 1970-05-28          Age: 49 y.o.  Admission Date: 08/31/2019             LOS: 0 days        Discharge Note:    Condition    Patient's mood and though contents have improved and are appropriate for discharge.    Disposition    Patient is leaving with Mother by car to home. All items from room, belonging bin, medication room, and safe are returned to the patients.    Discharge Instructions    Discharge instructions and psychiatry summary of care were discussed and provided. A copy will be transmitted to the next level of care provider within 24 hours after discharge. Patient/family expresses understanding.

## 2019-09-01 NOTE — Discharge Instructions - Pharmacy
Discharge Summary      Name: Kevin Oliver  Medical Record Number: [id number]        Account Number:  [id number]  Date Of Birth:  September 13, 1970                         Age:  49 years   Admit date:  08/31/2019                     Discharge date:  08/31/19    Discharge Attending:  Cinda Quest, MD  Discharge Summary Completed By: Meryl Crutch, MD    Service: [facility] Adult [facility]    Reason for hospitalization:  Psychosis, schizophrenia, simple (HCC) [F20.89]    Primary Discharge Diagnosis:   Bipolar affective disorder, current episode manic [facility])      Hospital Diagnoses:  Hospital Problems        Active Problems    * (Principal) Bipolar affective disorder, current episode manic (HCC)        Significant Past Medical History        Bipolar 1 disorder (HCC)  COPD (chronic obstructive pulmonary disease) (HCC)      Comment:  hx of emphysema  HIV (human immunodeficiency virus infection) (HCC)  Psychiatric illness  PTSD (post-traumatic stress disorder)    Allergies   Haldol [haloperidol lactate], Depakote [divalproex], and Risperidone    Brief Hospital Course   The patient was admitted and the following issues were addressed during this hospitalization: Kevin Oliver is a 49 y.o. male with a history of bipolar disorder, PTSD, HIV, emphysema admitted from [facility] center where he initially presented on 08/29/19 after he stole a car and was psychotic when police arrested him. Per report he was in 4 point restraints in the emergency department but has been increasingly calm sicne being given medication including Risperdal 0.5 mg Ativan 2 mg x2, Bendaryl 50 mg IM, Geodon 20 mg IM.  On intake assessment he was calm and cooperative and engaged in interview. He was placed on unit 2 with safety monitoring. He was seen by internal medicine for physical exam. Appropriate labs were reviewed and incorporated into plan.  He was started on Latuda for bipolar disorder. During intake he appeared to be forthcoming with information and did endorse a history of bipolar disorder. He requested to leave, and given that he had been calm and cooperative in the hospital, he was linear and organized in assessment without evidence of psychosis, and denied SI/HI, he did not meet criteria for an involuntary commitment and was therefore discharged as requested with plan to stay with his mom. Protective factors include family support and future orientation with plan to return to his job.          Items Needing Follow Up   Pending items or areas that need to be addressed at follow up: he is to establish care with psychiatrist and therapist.    Pending Labs and Follow Up Radiology   Pending labs and/or radiology review at this time of discharge are listed below: if this area is blank, there are no items for review.         Medications   Is the patient being discharged on two or more antipsychotic medications? No    Metabolic Monitoring:         Body mass index is 26.25 kg/m?Marland Kitchen  Wt Readings from Last 3 Encounters:   08/31/19 95.3 kg (210  lb)     BP Readings from Last 3 Encounters:   08/31/19 126/68   11/08/14 125/69     No results found for: CHOL, TRIG, HDL, LDL, VLDL, NONHDLCHOL, CHOLHDLC  No results found for: HGBA1C    Patient instructions/medications:   Medication List      CONTINUE taking these medications    ? bictegravir-emtricitabine-tenofovir ala 50-200-25 mg tablet; Commonly   known as: BIKTARVY; Dose: 1 tablet; Refills: 0       Return Appointments and Scheduled Appointments   No appointment scheduled    Consults, Procedures, Diagnostics, Micro, Pathology   Consults: Internal Medicine  Surgical Procedures & Dates: None  Significant Diagnostic Studies, Micro and Procedures: none  Significant Pathology: none  Nutrition: No Dietitian Consult    Discharge Disposition, Condition   Patient Disposition: Home  Condition at Discharge: Stable    Code Status     Code Status History     This patient has a current code status but no historical code status.          Patient Instructions        Regular Diet    You have no dietary restriction. Please continue with a healthy balanced diet.     Report These Signs and Symptoms    Please contact your doctor if you have any of the following symptoms: worsening of depression, thoughts of hurting self or others, aggression, or auditory or visual hallucinations.    Report stiffness, restlessness, abnormal movements which may be side effects of antipsychotic medications. Report excessive weight gain which may contribute to diabetes, high blood pressure, elevated cholesterol.     Questions About Your Stay    For questions or concerns regarding your hospital stay, call [phone].     Discharging attending physician: Meryl Crutch [[id number]]      Activity as Tolerated    It is important to keep increasing your activity level after you leave the hospital.       Additional Orders: Case Management, Supplies, Home Health     Home Health/DME     None            Signed:  Meryl Crutch, MD  08/31/2019      cc:  Primary Care Physician:  No Pcp, Na   No PCP  Referring physicians:  No Pcp, Na   Additional provider(s):        Did we miss something? If additional records are needed, please fax a request on office letterhead to [phone]. Please include the patient's name, date of birth, fax number and type of information needed. Additional request can be made by email at [email or web address]. For general questions of information about electronic records sharing, call [phone].

## 2019-09-05 ENCOUNTER — Encounter: Admit: 2019-09-05 | Discharge: 2019-09-05 | Payer: MEDICARE

## 2019-09-05 NOTE — Telephone Encounter
Attempted to reach invalid number

## 2021-05-11 ENCOUNTER — Encounter: Admit: 2021-05-11 | Discharge: 2021-05-11

## 2021-05-11 ENCOUNTER — Emergency Department: Admit: 2021-05-11 | Discharge: 2021-05-11 | Disposition: A | Discharge status: Home / Self Care

## 2021-05-11 DIAGNOSIS — F319 Bipolar disorder, unspecified: Secondary | ICD-10-CM

## 2021-05-11 DIAGNOSIS — B20 Human immunodeficiency virus [HIV] disease: Secondary | ICD-10-CM

## 2021-05-11 DIAGNOSIS — J449 Chronic obstructive pulmonary disease, unspecified: Secondary | ICD-10-CM

## 2021-05-11 DIAGNOSIS — F99 Mental disorder, not otherwise specified: Secondary | ICD-10-CM

## 2021-05-11 DIAGNOSIS — F4310 Post-traumatic stress disorder, unspecified: Secondary | ICD-10-CM

## 2021-05-11 DIAGNOSIS — F29 Unspecified psychosis not due to a substance or known physiological condition: Secondary | ICD-10-CM

## 2021-05-11 LAB — BARBITURATES-URINE RANDOM: BARBITURATES: NEGATIVE

## 2021-05-11 LAB — URINALYSIS MICROSCOPIC REFLEX TO CULTURE

## 2021-05-11 LAB — CBC AND DIFF
ABSOLUTE BASO COUNT: 0 K/UL (ref 0–0.20)
ABSOLUTE EOS COUNT: 0.1 K/UL (ref 0–0.45)
ABSOLUTE MONO COUNT: 0.6 K/UL (ref 0–0.80)
WBC COUNT: 6.7 K/UL (ref 4.5–11.0)

## 2021-05-11 LAB — BENZODIAZEPINES-URINE RANDOM: BENZODIAZEPINES: NEGATIVE

## 2021-05-11 LAB — URINALYSIS DIPSTICK REFLEX TO CULTURE: URINE BILE: NEGATIVE

## 2021-05-11 LAB — COMPREHENSIVE METABOLIC PANEL
ALBUMIN: 3.9 g/dL (ref 3.5–5.0)
ALK PHOSPHATASE: 77 U/L (ref 25–110)
ALT: 22 U/L (ref 7–56)
ANION GAP: 10 K/UL (ref 3–12)
AST: 27 U/L (ref 7–40)
CALCIUM: 9.1 mg/dL (ref 8.5–10.6)
CO2: 25 MMOL/L (ref 21–30)
CREATININE: 1 mg/dL (ref 0.4–1.24)
EGFR: >60 mL/min (ref >60)
SODIUM: 138 MMOL/L — ABNORMAL LOW (ref 137–147)
TOTAL BILIRUBIN: 0.2 mg/dL — ABNORMAL LOW (ref 0.3–1.2)
TOTAL PROTEIN: 7.1 g/dL (ref 6.0–8.0)

## 2021-05-11 LAB — AMPHETAMINES-URINE RANDOM: AMPHETAMINES: NEGATIVE

## 2021-05-11 LAB — TSH WITH FREE T4 REFLEX: TSH: 2.1 uU/mL — ABNORMAL LOW (ref 0.35–5.00)

## 2021-05-11 LAB — CANNABINOIDS-URINE RANDOM: CANNABINOIDS (THC): NEGATIVE

## 2021-05-11 LAB — COCAINE-URINE RANDOM: COCAINE: NEGATIVE

## 2021-05-11 LAB — COVID-19 (SARS-COV-2) PCR

## 2021-05-11 LAB — OPIATES-URINE RANDOM: OPIATES: NEGATIVE

## 2021-05-11 LAB — ALCOHOL LEVEL: ALCOHOL: <10 mg/dL — ABNORMAL LOW (ref 3.5–5.1)

## 2021-05-11 LAB — PHENCYCLIDINES-URINE RANDOM: PHENCYCLIDINE (PCP): NEGATIVE

## 2021-05-11 LAB — FENTANYL URINE: FENTANYL URINE: NEGATIVE

## 2021-05-11 MED ORDER — NICOTINE (POLACRILEX) 4 MG BU GUM
4 mg | BUCCAL | 0 refills | Status: DC | PRN
Start: 2021-05-11 — End: 2021-05-11
  Administered 2021-05-11: 19:00:00 4 mg via BUCCAL

## 2021-05-11 MED ORDER — LORAZEPAM 1 MG PO TAB
2 mg | Freq: Once | ORAL | 0 refills | Status: CP
Start: 2021-05-11 — End: ?
  Administered 2021-05-11: 18:00:00 2 mg via ORAL

## 2021-05-11 MED ORDER — LORAZEPAM 1 MG PO TAB
1 mg | Freq: Once | ORAL | 0 refills | Status: DC
Start: 2021-05-11 — End: 2021-05-11

## 2021-05-11 NOTE — ED Notes
Pt refused to wait any longer to be transported to [facility] and ambulated out of department with all belongings. Dr. Maisie Fus aware.

## 2021-05-11 NOTE — ED Notes
Kevin Oliver is a 51 y.o. male who presents to [facility] with CC of Psych Assessment. Pt states he has not slept in the last four days since he was released from jail and would like to go to [facility] for some "R&R." When attempting to gain information regarding what brought pt to the hospital, he begins on minutes long tangents regarding various life situations. Pt with various discussions surrounding Jesus, sheep, olive branches, [place], wolves, and his family. Pt denies any pain or medical complaints at this time. Pt placed in yellow gown, CO at bedside, provided blankets, water, and food. No needs identified at this time.    Medical History:   Diagnosis Date    Bipolar 1 disorder (HCC)     COPD (chronic obstructive pulmonary disease) (HCC)     hx of emphysema    HIV (human immunodeficiency virus infection) (HCC)     Psychiatric illness     PTSD (post-traumatic stress disorder)

## 2021-05-11 NOTE — ED Notes
Psychiatric Liaison Services Evaluation:    Name: Capri Sadlier   MRN: [id number]     DOB: 1970-06-08      Age: 51 y.o.  Admission Date: 05/11/2021     LOS: 0 days     Date of Service: 05/11/2021      Gender:  Male  Referred by:  self  Accompanied by: self    Chief Complaint:  Manic behavior/racing thoughts    History of Present Illness:  Patient states that his thoughts go faster than Google and he needs medication sometimes to slow them down. Patient is unable to stay on topic during assessment, constantly changing subjects and talking without naturals breaks in conversation.    Patient is disorganized, with rapid and extraneous speech; including, but not limited to; information about [employer], [employer], his annual birthday drink, his grandma's [address], and the fact that his body cells contain 29% more water than anyone else's cells. Details offered in response to any one question, are too many to list, and not related to the question. Patient was unable to recall what medication he has taken in the past; but noted that he can't speak without it. His whole family is like that.    Patient appears to be experiencing some delusions of grandeur, reporting that he and [facility] are best friends and he helped AW Janelle Floor write his last book. Patient endorses some AH, but no VH and denies SI or HI. He requests admission to [facility] for the purpose of observation and stabilization.       Psych History: Admissions to [facility] in 2016 and 2021. Patient offered no additional information    Past Medical History:   Medical History:   Diagnosis Date   ? Bipolar 1 disorder (HCC)    ? COPD (chronic obstructive pulmonary disease) (HCC)     hx of emphysema   ? HIV (human immunodeficiency virus infection) (HCC)    ? Psychiatric illness    ? PTSD (post-traumatic stress disorder)        Past Surgical History: History reviewed. No pertinent surgical history.    Substance Abuse History:  Social History     Tobacco Use   ? Smoking status: Every Day     Packs/day: 1.00     Years: 35.00     Pack years: 35.00     Types: Cigarettes   ? Smokeless tobacco: Never   Vaping Use   ? Vaping Use: Never used   Substance Use Topics   ? Alcohol use: Not Currently   ? Drug use: Not Currently     Types: Marijuana     Social History     Substance and Sexual Activity   Drug Use Not Currently   ? Types: Marijuana       Family History:  No family history on file.    Allergies:  Allergies   Allergen Reactions   ? Haldol [Haloperidol Lactate] DYSTONIA   ? Depakote [Divalproex] UNKNOWN     Reports that it causes his WBC to decrease   ? Risperidone AGITATION       Medications:    (Not in a hospital admission)      Social History:   Marital Status: Single    Living Situation: Homeless            Developmental History:  Not assessed    Mental Status Examination:  Flow of Thought: Racing thoughts, Tangential  Intellect: Above normal  Sensorium:  Normal  Memory: Normal  Insight / Judgment: Fair insight, Fair judgment  General Appearance: Normal  Behavior: Cooperative  Motor Activity: Normal  Speech: Excessive amount  Mood / Affect: Congruent  Content Of Thought: Denies homicidal thoughts, Denies suicidal ideation, Denies visual hallucinations        Conduct Disturbance: Normal  Eating/Sleep Disturbance: Increased appetite, Insomnia  Interview Behavior: Normal  Med/TX Compliance: Meds-no, TX-no    Suicide Risk Initial Screening:       Suicide Risk Re-Screening:       Suicide Risk Assesssment: low    Disposition: Patient meets criteria for admission to an inpatient psychiatric facility    Reviewed: Dr. Holly Bodily    Attending Physician:  Dr. Holly Bodily

## 2022-04-03 ENCOUNTER — Emergency Department
Admission: EM | Admit: 2022-04-03 | Discharge: 2022-04-04 | Disposition: A | Discharge status: Other Acute Inpatient Hospital | Payer: Medicare Other | Attending: Emergency Medicine | Admitting: Emergency Medicine

## 2022-04-03 DIAGNOSIS — F319 Bipolar disorder, unspecified: Secondary | ICD-10-CM | POA: Insufficient documentation

## 2022-04-03 DIAGNOSIS — Z91148 Patient's other noncompliance with medication regimen for other reason: Secondary | ICD-10-CM | POA: Insufficient documentation

## 2022-04-03 DIAGNOSIS — F919 Conduct disorder, unspecified: Secondary | ICD-10-CM | POA: Insufficient documentation

## 2022-04-03 DIAGNOSIS — Z21 Asymptomatic human immunodeficiency virus [HIV] infection status: Secondary | ICD-10-CM | POA: Insufficient documentation

## 2022-04-03 DIAGNOSIS — F250 Schizoaffective disorder, bipolar type: Secondary | ICD-10-CM | POA: Insufficient documentation

## 2022-04-03 DIAGNOSIS — Z1152 Encounter for screening for COVID-19: Secondary | ICD-10-CM | POA: Insufficient documentation

## 2022-04-03 DIAGNOSIS — F209 Schizophrenia, unspecified: Secondary | ICD-10-CM | POA: Insufficient documentation

## 2022-04-03 MED ORDER — DIAZEPAM 5 MG PO TABS
10.0000 mg | ORAL_TABLET | Freq: Once | ORAL | Status: AC
Start: 2022-04-03 — End: 2022-04-03
  Administered 2022-04-03: 10 mg via ORAL
  Filled 2022-04-03: qty 2

## 2022-04-03 NOTE — ED Notes (Signed)
Per [place], patient is HIV+. Non-compliant with medications.

## 2022-04-03 NOTE — ED Notes (Signed)
Went to patient under the care of the police and patient refused to be changed, did not want [place] to do either the EKG or bloodwork. Notified nurse.

## 2022-04-03 NOTE — ED Triage Notes (Signed)
Patient arrives from jail under ECO. Awaiting TDO. Patient denies SI/HI. Yelling at staff and uncooperative. Denying SI/HI. Patient requesting Abilify shot. Patient has rapid speech. Train of ideas. Otherwise, patient refusing RN assessment questions, EKG and lab work.

## 2022-04-03 NOTE — EDIE (Signed)
[facility]?NOTIFICATION?04/03/2022 22:20?[name], [name]?MRN: [id number]    Criteria Met      3 Different Facilities in 90 Days    5 ED Visits in 12 Months    Security and Safety  No Security Events were found.  ED Care Guidelines  There are currently no ED Care Guidelines for this patient. Please check your facility's medical records system.        Prescription Monitoring Program  Narx Score not available at this time.    E.D. Visit Count (12 mo.)  Facility Visits   [facility]   Total 6   Note: Visits indicate total known visits.     Recent Emergency Department Visit Summary  Date Facility [facility] Type Diagnoses or Chief Complaint    Apr 03, 2022  [place].  [place].  [place]  Emergency      ECO      Medical Clearance      Call in - medical clearance      Mar 23, 2022  [place] [facility]  [place] Ravel.  [place]  Emergency      Mental disorder, not otherwise specified      [place]      Mar 14, 2022  [place].  [place].  [place]  Emergency      Anxiety      Nov 24, 2021  [place]  Staff.  [place]  Emergency      Brief psychotic disorder      Mental Health Problem      RRJ      Nov 23, 2021  [place] [facility]  [place].  [place]  Emergency      Brief psychotic disorder      Mental Health Problem      EMS      May 11, 2021  [place]  [place].  [place]  Emergency      Encounter for screening, unspecified      Body mass index [BMI] 24.0-24.9, adult        Recent Inpatient Visit Summary  No Recent Inpatient Visits were found.  Care Team  No Care Team was found.  [facility]  This patient has registered at the [facility] Emergency Department  For more information visit: [email or web address]     PLEASE NOTE:     1.   Any care recommendations and other clinical information are  provided as guidelines or for historical purposes only, and providers should exercise their own clinical judgment when providing care.    2.   You may only use this information for purposes of treatment, payment or health care operations activities, and subject to the limitations of applicable [facility] Policies.    3.   You should consult directly with the organization that provided a care guideline or other clinical history with any questions about additional information or accuracy or completeness of information provided.    ? [id number] [facility] - www.[facility].com

## 2022-04-03 NOTE — ED Provider Notes (Addendum)
[facility] EMERGENCY DEPARTMENT H&P         CLINICAL SUMMARY          Diagnosis:    .     Final diagnoses:   Behavior disturbance                 Disposition:      ED Disposition       ED Disposition   Transfer to Another Facility    Condition   --    Date/Time   Thu Apr 04, 2022 12:36 PM    Comment   Tonita Cong should be transferred out to [facility] accepted by Dr. Levada Dy                              CLINICAL INFORMATION        HPI:      Chief Complaint: Medical Clearance  and ECO  .    Anatoliy Stockert is a 52 y.o. male who presents under TDO from jail for medical clearance. Hx of mental health issues and was previously on Abilify, not taking anymore. Per [facility], pt is HIV+ and noncompliant with medications. Denies any cough, fever, chills, SI/HI.     History obtained from: Patient      ROS:      Positive and negative ROS elements as per HPI.   All Other Systems Reviewed and Negative: Yes        Physical Exam:      Pulse 76  BP (!) 154/92  Resp 17  SpO2 100 %  Temp 97.2 F (36.2 C)    Physical Exam   Nursing note and vitals reviewed.   Constitutional: . Pt appears well-developed and well-nourished.  Eyes: Conj normal . No icterus  ENT: nl phonation no ear [place]  Cardiovascular: Normal rate, regular rhythm and normal heart sounds.   Pulmonary/Chest: Effort normal  No respiratory distress. breath sounds normal.  GI: Soft. There is no guarding. There is no tenderness  Musculoskeletal:  no deformity. FROM  Neurological: A&Ox3 GCS eye subscore is 4. GCS verbal subscore is 5. GCS motor subscore is 6. MAE   Skin: Skin is warm and dry.   Psychiatric: Pt has an abnormal affect. Pt behavior is abnormal.               PAST HISTORY        Primary Care Provider: Pcp, None, MD        PMH/PSH:    .     History reviewed. No pertinent past medical history.    He has no past surgical history on file.      Social/Family History:      He has no history on file for tobacco use, alcohol use, and drug use.    History reviewed. No  pertinent family history.      Listed Medications on Arrival:    .     There are no discharge medications for this patient.     Allergies: He has No Known Allergies.            VISIT INFORMATION                    Medications Given in the ED:    .     ED Medication Orders (From admission, onward)      Start Ordered     Status Ordering Provider    04/04/22 0900  04/04/22 0733    Daily        Route: Oral  Ordered Dose: 1 tablet       Discontinued SCHWARTZ, MATTHEW L    04/04/22 0602 04/04/22 0602    Every 4 hours PRN        Note to Pharmacy: Lorazepam '2mg'$ /mL injection is on severe shortage. Restricted to patients who are actively seizing, end of life / comfort care, chemotherapy induced n/v, or have contraindications to oral lorazepam. *IV:PO conversion of LORazepam is 1:1*   Route: Intramuscular  Ordered Dose: 2 mg       Discontinued Lasharn Bufkin E    04/04/22 0602 04/04/22 0602    Every 6 hours PRN        Route: Intramuscular  Ordered Dose: 5 mg       Discontinued Rondrick Barreira E    04/04/22 0126 04/04/22 0125  LORazepam (ATIVAN) injection 2 mg  Once        Note to Pharmacy: Lorazepam '2mg'$ /mL injection is on severe shortage. Restricted to patients who are actively seizing, end of life / comfort care, chemotherapy induced n/v, or have contraindications to oral lorazepam. *IV:PO conversion of LORazepam is 1:1*   Route: Intramuscular  Ordered Dose: 2 mg       Last MAR action: Given Danyl Deems E    04/04/22 0123 04/04/22 0122    Once        Route: Oral  Ordered Dose: 5 mg       Discontinued Khrista Braun E    04/04/22 0123 04/04/22 0122  OLANZapine (ZyPREXA) injection 10 mg  Once        Route: Intramuscular  Ordered Dose: 10 mg       Last MAR action: Given Osby Sweetin E    04/03/22 2330 04/03/22 2329  diazePAM (VALIUM) tablet 10 mg  Once        Route: Oral  Ordered Dose: 10 mg       Last MAR action: Given Darlisha Kelm E              Procedures:            Interpretations:      MDM:     Medical Decision  Making  Amount and/or Complexity of Data Reviewed  External Data Reviewed: notes.  ECG/medicine tests: ordered.    Risk  Prescription drug management.      Pulse Ox Analysis interpreted by me is  100 %% on RA nl without need for supplementation     Labs Independent review and Interpretation by me at time of visit    EKG InterpretationThe EKG was reviewed, analyzed and interpretation by me Lilla Shook, MD, MD  : I personally reviewed and interpreted  the patient's EKG and any available monitor strips in real-time. EKG shows normal sinus rbrady 47 nl intervals and axis    EKG InterpretationThe EKG was reviewed, analyzed and interpretation by me Lilla Shook, MD, MD   : I personally reviewed and interpreted  the patient's EKG and any available monitor strips in real-time. EKG shows normal sinus rhythm 68  Normal intervals and conduction.  No ST-T abnormalities.  Normal axis.  Overall impression is normal EKG.            DDX: psychosis, conduct disorder, substance abuse    Clinical Course in the ED:      Patient with periodic agitation requiring antipsychotics.  States historically has been on Abilify not taking.  Review of records does show evidence of substance abuse and mania in the past.  From some records appears patient has had hospitalizations for psychiatric illness in the past.  Patient refusing EKG.  Laboratory assessment benign.  Tox screen negative.       Patient signed out pending placement.    Discharge Prescriptions    None               This care is provided during an unprecedented national emergency due to the Novel Coronavirus (COVID-19). COVID-19 infections and transmission risks place heavy strains on healthcare resources. As this pandemic evolves, the Hospital and providers strive to respond fluidly, to remain operational, and to provide care relative to available resources and information. Outcomes are unpredictable and treatments are without well-defined guidelines. Further, the impact of  COVID-19 on all aspects of emergency care, including the impact to patients seeking care for reasons other than COVID-19, is unavoidable during this national emergency        *This note was generated by the Epic EMR system/ Dragon speech recognition and may contain inherent errors or omissions not intended by the user. Grammatical errors, random word insertions, deletions, pronoun errors and incomplete sentences are occasional consequences of this technology due to software limitations. Not all errors are caught or corrected. If there are questions or concerns about the content of this note or information contained within the body of this dictation they should be addressed directly with the author for clarification.*            RESULTS        Lab Results:      Results       Procedure Component Value Units Date/Time    Comprehensive metabolic panel [id number]  (Abnormal) Collected: 04/04/22 0154    Specimen: Blood Updated: 04/04/22 0235     Glucose 110 mg/dL      BUN 15.0 mg/dL      Creatinine 0.9 mg/dL      Sodium 139 mEq/L      Potassium 3.4 mEq/L      Chloride 109 mEq/L      CO2 24 mEq/L      Calcium 9.0 mg/dL      Protein, Total 6.6 g/dL      Albumin 3.5 g/dL      AST (SGOT) 24 U/L      ALT 20 U/L      Alkaline Phosphatase 72 U/L      Bilirubin, Total 0.4 mg/dL      Globulin 3.1 g/dL      Albumin/Globulin Ratio 1.1     Anion Gap 6.0     eGFR >60.0 mL/min/1.73 m2     COVID-19 (SARS-CoV-2) only ([facility]) - Behavioral health admission (no isolation) [id number] Collected: 04/04/22 0154    Specimen: Nasopharyngeal Updated: 04/04/22 0235     Purpose of COVID testing Screening     SARS-CoV-2 Specimen Source Nasal Swab     SARS CoV 2 Overall Result Not Detected    Narrative:      o Collect and clearly label specimen type:  o PREFERRED-Upper respiratory specimen: One Nasal Swab in  Transport Media.  o Hand deliver to laboratory ASAP  Indication for testing->Behavioral health admission  Screening    Ethanol (Alcohol)  Level [id number] Collected: 04/04/22 0154    Specimen: Blood Updated: 04/04/22 0234     Alcohol NONE DETECTED mg/dL     Salicylate Level [id number]  (Abnormal) Collected: 04/04/22 0154  Specimen: Blood Updated: [id number]     Salicylate Level 0000000 mg/dL     Acetaminophen Level [id number]  (Abnormal) Collected: 04/04/22 0154    Specimen: Blood Updated: 04/04/22 0234     Acetaminophen Level <3 ug/mL     CBC and differential [id number]  (Abnormal) Collected: 04/04/22 0154    Specimen: Blood Updated: 04/04/22 0216     WBC 5.39 x10 3/uL      Hgb 13.9 g/dL      Hematocrit 41.0 %      Platelets 180 x10 3/uL      RBC 4.21 x10 6/uL      MCV 97.4 fL      MCH 33.0 pg      MCHC 33.9 g/dL      RDW 12 %      MPV 10.0 fL      Instrument Absolute Neutrophil Count 2.18 x10 3/uL      Neutrophils 40.4 %      Lymphocytes Automated 48.2 %      Monocytes 8.2 %      Eosinophils Automated 2.2 %      Basophils Automated 0.6 %      Immature Granulocytes 0.4 %      Nucleated RBC 0.0 /100 WBC      Neutrophils Absolute 2.18 x10 3/uL      Lymphocytes Absolute Automated 2.60 x10 3/uL      Monocytes Absolute Automated 0.44 x10 3/uL      Eosinophils Absolute Automated 0.12 x10 3/uL      Basophils Absolute Automated 0.03 x10 3/uL      Immature Granulocytes Absolute 0.02 x10 3/uL      Absolute NRBC 0.00 x10 3/uL     Rapid drug screen, urine [id number] Collected: 04/03/22 2353    Specimen: Urine Updated: 04/04/22 0024     Urine Amphetamine Screen Negative     Barbiturate Screen, UR Negative     Benzodiazepine Screen, UR Negative     Cannabinoid Screen, UR Negative     Cocaine, UR Negative     Urine Fentanyl Negative     Opiate Screen, UR Negative     PCP Screen, UR Negative                Radiology Results:      No orders to display               Scribe Attestation:      I was acting as a scribe for No att. providers found on Carmer,Eulas  Treatment Team: Scribe: Azzie Almas; Scribe: Sharon Seller   I am the first provider for this patient  and I personally performed the services documented. Treatment Team: Scribe: Azzie Almas; Scribe: Sharon Seller is scribing for me on Mccartney,Jermarion. This note accurately reflects work and decisions made by me.  No att. providers found            Lilla Shook, MD  04/04/22 [id number]       Lilla Shook, MD  04/30/22 1059

## 2022-04-04 DIAGNOSIS — Z008 Encounter for other general examination: Secondary | ICD-10-CM

## 2022-04-04 DIAGNOSIS — F250 Schizoaffective disorder, bipolar type: Secondary | ICD-10-CM

## 2022-04-04 DIAGNOSIS — F1990 Other psychoactive substance use, unspecified, uncomplicated: Secondary | ICD-10-CM

## 2022-04-04 LAB — URINE DRUGS OF ABUSE SCREEN
Barbiturate Screen, UR: NEGATIVE
Benzodiazepine Screen, UR: NEGATIVE
Cannabinoid Screen, UR: NEGATIVE
Cocaine, UR: NEGATIVE
Opiate Screen, UR: NEGATIVE
PCP Screen, UR: NEGATIVE
Urine Amphetamine Screen: NEGATIVE
Urine Fentanyl: NEGATIVE

## 2022-04-04 LAB — COMPREHENSIVE METABOLIC PANEL
ALT: 20 U/L (ref 0–55)
AST (SGOT): 24 U/L (ref 5–41)
Albumin/Globulin Ratio: 1.1 (ref 0.9–2.2)
Albumin: 3.5 g/dL (ref 3.5–5.0)
Alkaline Phosphatase: 72 U/L (ref 37–117)
Anion Gap: 6 (ref 5.0–15.0)
BUN: 15 mg/dL (ref 9.0–28.0)
Bilirubin, Total: 0.4 mg/dL (ref 0.2–1.2)
CO2: 24 mEq/L (ref 17–29)
Calcium: 9 mg/dL (ref 8.5–10.5)
Chloride: 109 mEq/L (ref 99–111)
Creatinine: 0.9 mg/dL (ref 0.5–1.5)
Globulin: 3.1 g/dL (ref 2.0–3.6)
Glucose: 110 mg/dL — ABNORMAL HIGH (ref 70–100)
Potassium: 3.4 mEq/L — ABNORMAL LOW (ref 3.5–5.3)
Protein, Total: 6.6 g/dL (ref 6.0–8.3)
Sodium: 139 mEq/L (ref 135–145)
eGFR: >60 mL/min/{1.73_m2} (ref >=60)

## 2022-04-04 LAB — ECG 12-LEAD
Atrial Rate: 47 {beats}/min
Atrial Rate: 68 {beats}/min
IHS MUSE NARRATIVE AND IMPRESSION: NORMAL
P Axis: 45 degrees
P Axis: 27 degrees
P-R Interval: 158 ms
P-R Interval: 154 ms
Q-T Interval: 420 ms
Q-T Interval: 384 ms
QRS Duration: 82 ms
QRS Duration: 80 ms
QTC Calculation (Bezet): 371 ms
QTC Calculation (Bezet): 408 ms
R Axis: 71 degrees
R Axis: 76 degrees
T Axis: 64 degrees
T Axis: 79 degrees
Ventricular Rate: 47 {beats}/min
Ventricular Rate: 68 {beats}/min

## 2022-04-04 LAB — CBC AND DIFFERENTIAL
Absolute NRBC: 0 10*3/uL (ref 0.00–0.00)
Basophils Absolute Automated: 0.03 10*3/uL (ref 0.00–0.08)
Basophils Automated: 0.6 %
Eosinophils Absolute Automated: 0.12 10*3/uL (ref 0.00–0.44)
Eosinophils Automated: 2.2 %
Hematocrit: 41 % (ref 37.6–49.6)
Hgb: 13.9 g/dL (ref 12.5–17.1)
Immature Granulocytes Absolute: 0.02 10*3/uL (ref 0.00–0.07)
Immature Granulocytes: 0.4 %
Instrument Absolute Neutrophil Count: 2.18 10*3/uL (ref 1.10–6.33)
Lymphocytes Absolute Automated: 2.6 10*3/uL (ref 0.42–3.22)
Lymphocytes Automated: 48.2 %
MCH: 33 pg (ref 25.1–33.5)
MCHC: 33.9 g/dL (ref 31.5–35.8)
MCV: 97.4 fL — ABNORMAL HIGH (ref 78.0–96.0)
MPV: 10 fL (ref 8.9–12.5)
Monocytes Absolute Automated: 0.44 10*3/uL (ref 0.21–0.85)
Monocytes: 8.2 %
Neutrophils Absolute: 2.18 10*3/uL (ref 1.10–6.33)
Neutrophils: 40.4 %
Nucleated RBC: 0 /100 WBC (ref 0.0–0.0)
Platelets: 180 10*3/uL (ref 142–346)
RBC: 4.21 10*6/uL (ref 4.20–5.90)
RDW: 12 % (ref 11–15)
WBC: 5.39 10*3/uL (ref 3.10–9.50)

## 2022-04-04 LAB — ETHANOL (ALCOHOL) LEVEL: Alcohol: NOT DETECTED mg/dL

## 2022-04-04 LAB — SARS-COV-2 (COVID-19) RNA, PCR (LIAT): SARS-CoV-2 Overall Result: NOT DETECTED

## 2022-04-04 LAB — ACETAMINOPHEN LEVEL: Acetaminophen Level: <3 ug/mL — ABNORMAL LOW (ref 10–30)

## 2022-04-04 LAB — SALICYLATE LEVEL: Salicylate Level: <5 mg/dL — ABNORMAL LOW (ref 15.0–30.0)

## 2022-04-04 MED ORDER — LORAZEPAM 2 MG/ML IJ SOLN
2.0000 mg | Freq: Once | INTRAMUSCULAR | Status: AC
Start: 2022-04-04 — End: 2022-04-04
  Administered 2022-04-04: 2 mg via INTRAMUSCULAR
  Filled 2022-04-04: qty 1

## 2022-04-04 MED ORDER — OLANZAPINE 10 MG IM SOLR
10.0000 mg | Freq: Once | INTRAMUSCULAR | Status: AC
Start: 2022-04-04 — End: 2022-04-04

## 2022-04-04 MED ORDER — LORAZEPAM 2 MG/ML IJ SOLN
2.0000 mg | INTRAMUSCULAR | Status: DC | PRN
Start: 2022-04-04 — End: 2022-04-04
  Administered 2022-04-04 (×2): 2 mg via INTRAMUSCULAR
  Filled 2022-04-04 (×2): qty 1

## 2022-04-04 MED ORDER — OLANZAPINE 10 MG IM SOLR
INTRAMUSCULAR | Status: AC
Start: 2022-04-04 — End: 2022-04-04
  Administered 2022-04-04: 10 mg via INTRAMUSCULAR
  Filled 2022-04-04: qty 10

## 2022-04-04 MED ORDER — OLANZAPINE 5 MG PO TBDP
5.0000 mg | ORAL_TABLET | Freq: Once | ORAL | Status: DC
Start: 2022-04-04 — End: 2022-04-04

## 2022-04-04 MED ORDER — HALOPERIDOL LACTATE 5 MG/ML IJ SOLN
5.0000 mg | Freq: Four times a day (QID) | INTRAMUSCULAR | Status: DC | PRN
Start: 2022-04-04 — End: 2022-04-04
  Administered 2022-04-04: 5 mg via INTRAMUSCULAR
  Filled 2022-04-04: qty 1

## 2022-04-04 MED ORDER — BICTEGRAVIR-EMTRICITAB-TENOFOV 50-200-25 MG PO TABS
1.0000 | ORAL_TABLET | Freq: Every day | ORAL | Status: DC
Start: 2022-04-04 — End: 2022-04-04
  Administered 2022-04-04: 1 via ORAL
  Filled 2022-04-04 (×2): qty 1

## 2022-04-04 NOTE — ED Notes (Signed)
[facility] will be doing the bed search for this patient. If you have any question or concerns, please call them at [phone].    If central access has any updates, they will immediately contact the ED and/or secure chat the ED with updates on bed placement and acceptance information.

## 2022-04-04 NOTE — ED Notes (Signed)
Patient becoming increasingly agitated, kicking the bedside officers. Kopack MD at bedside. See MAR for meds.   RN attempted to place pulse ox on patient, patient refused stating "I don't want the government  to monitor me".

## 2022-04-04 NOTE — ED Provider Notes (Signed)
[facility] SUBSEQUENT CARE NOTE     Patient Name: [name] [name]  Department:[facility]  Initial Encounter Date:  04/03/2022  Today's Date:  04/04/22                                                              Clinical Course:      Patient received in bedside sign out from Dr. Dareen Piano, MD at approximately 7:13 AM    EKG -             interpreted by me: normal sinus rhythm at 68, normal axis, no ectopy, no acute ST or T wave changes.        Clinical Course:  Medically cleared  Accepted for admit to [facility]         Final Impression:   Final diagnoses:   Behavior disturbance                                                               Orders Placed During This Visit:     Encounter Orders:  Orders Placed This Encounter   Procedures    COVID-19 (SARS-CoV-2) only ([facility]) - Behavioral health admission (no isolation)    CBC and differential    Comprehensive metabolic panel    Ethanol (Alcohol) Level    Rapid drug screen, urine    Salicylate Level    Acetaminophen Level    Vital signs    Document    Behavioral Health Central Access Consult    ECG 12 lead    ECG 12 lead (Electrocardiogram)    ECG 12 lead (Electrocardiogram)       Encounter Medications:  Medications   OLANZapine zydis (ZyPREXA ZYDIS) disintegrating tablet 5 mg (5 mg Oral Not Given 04/04/22 0202)   haloperidol lactate (HALDOL) injection 5 mg (5 mg Intramuscular Given 04/04/22 0610)   LORazepam (ATIVAN) injection 2 mg (2 mg Intramuscular Given 04/04/22 0610)   diazePAM (VALIUM) tablet 10 mg (10 mg Oral Given 04/03/22 2331)   OLANZapine (ZyPREXA) injection 10 mg (10 mg Intramuscular Given 04/04/22 0125)   LORazepam (ATIVAN) injection 2 mg (2 mg Intramuscular Given 04/04/22 0146)          Allergies & Medications:     Allergies:  No Known Allergies    Home Medications:  Home Medications    No Medications                                                                  Disclaimer:     The purpose of this note is to  serve as a simplified record of care for the patient's extended emergency department course. Please refer to the initial provider note for a full history and physical exam.           Dareen Piano,  MD  04/05/22 [id number]

## 2022-04-04 NOTE — ED Notes (Signed)
12:30: Probation officer attended the daily [facility] Huddle to receive bed search updates from [place] and [place] of the CSB.    Pt is currently in [facility] ED under the status of a TDO with a current ED LOS of 13 hours. The updates on pt's status are as outlined.     Bed Search Update via [facility]:       Additional CSB Notes:  Per CSB, Maraj has been accepted to [facility] by Dr. Levada Dy at 1200. N2N is [phone]    TDO Expiration (per [facility]):  04/08/2022 at 1700

## 2022-04-04 NOTE — Consults (Signed)
PSYCHIATRIC EMERGENCY ROOM  ASSESSMENT       Date of Service: April 04, 2022    Date/time that TDO was executed: 04/03/22, 10 pm     Summary of Assessment:   [name] [name] is a 52 yo unemployed, undomicilied male with PMHX of + HIV (2014) and psychiatric history of schizophrenia, bipolar type and substance use disorder brought in under TDO with psychosis and disruptive behaviors with inability to care for himself. Pt leads a nomadic lifestyle, he has been in 5 states in the last 18 months and has had hospitalizations in all states per mother's collateral report. Most recently, pt was in [facility] for over 2 months for psychosis. Pt was discharged on 03/15/23 on abilify LAI per mother and patient, last dose 03/12/22. Pt is due on 04/12/22 but is asking for it today. Pt takes no other psychiatric medications. Pt denies any SI/HI/AVH upon admission and required PRN meds overnight for agitation.   This morning patient is disheveled, has urinated in his bed, and his speech is rapid and pressured and incomprehensible. Pt continues to ask for Abilify injection, not comprehending next due date.   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 UDS today is negative. Unable to truly assess for safety at this time.     Mother was called for collateral: confirms that he has been traveling all over the states - [place], [place], [place], [place] most recently. Mother is in the [place] and does not disclose her location. She reports that he has cycles with his schizophrenia and mania, she feels it is stress induced. Mother confirms that last dose of Abilify LAI was 03/12/22  - due in 28 days.     Psychiatric Diagnoses:   Schizoaffective, bipolar type  Acute mania vs psychosis  H/o substance use disorder    Medical Diagnoses:  HIV +    Meds/behavior in Past 24 hours:  Agitation/aggression:Yes, overnight with admission, verbally aggressive, yelling at  staff.  Emergency PRNs given?: Yes, given prn meds overnight, Ativan, haldol, zyprexa.  More than one restraint: Yes, 4 point overnight, now 2 point at noon.    Plan of Care in the ED:  Medications:   Xyprexa 5 mg qday started - pt did not get first dose due to discharge and delay in getting EKG  Code status: Full  Diet: regular adult diet    Safety in the ED:  Suicidal ideation:  unable to assess, pt states no but is not coherent   Suicidal plan: UTA  Self-injurious behavior: UTA  Homicidal ideation: UTA  Homicidal Plan: UTA  Other Safety Concerns: No  Recommendation for sitter:  '[]'$ Yes  '[x]'$ No, maintain regular rounding schedule by RN's/Techs per ED protocol      Agitation recommendations for adult patients:  --Prioritize verbal de-escalation and behavioral interventions for management of agitation.-  --For agitation refractory to behavioral measures, can provide olanzapine 5-10 mg PO or risperidone 2 mg PO  --If patient is unable/unwilling to accept p.o. medication and presents as a danger to self or others, can provide olanzapine 10 mg IM every 2 hours to a max of 30 mg/24 hours or ziprasidone IM 10 mg every 2 hours or  20 mg every 4 hours to a max of 40 mg/24 hours.    --If initial antipsychotic is ineffective, addition of a benzodiazepine is preferred over additional doses of antipsychotic.   However, do not combine IM olanzapine with IM lorazepam  due to risk of respiratory depression.    Continue to monitor EKG if multiple doses of antipsychotics are administered   Do not administer antipsychotics if QTc >560m.   Qtc - 408    Barriers affecting placement:   TDO  Verbally aggressive behavior    Dis[facility]  [facility] ______________________________________________________________________    Informants: Patient, medical record, ED staff    Chief Complaint:   Chief Complaint   Patient presents with    [place] Severity [place] Scale:  '[x]'$  Unable  to assess due to psychiatric condition - pt denies SI at this time   04/04/22 1[place]icide Severity Rating Scale (Short Version)   1. In the past month - Have you wished you were dead or wished you could go to sleep and not wake up? No   2. In the past month - Have you actually had any thoughts of killing yourself? No   6. Have you ever done anything, started to do anything, or prepared to do anything to end your life No         History of present illness:  [name] [name] a 52y.o.  unemployed, undomicilied male with PMHX of + HIV (2014) and psychiatric history of schizophrenia, bipolar type and substance use disorder brought in under TDO with psychosis and disruptive behaviors with inability to care for himself. Pt leads a nomadic lifestyle, he has been in 5 states in the last 18 months and has had hospitalizations in all states per mother's collateral report. [facility] He is currently on Abilify LAI.     Pt is alert to person only, no orientation to place, date or situation. He is disheveled in his bed, agitated with interview, asking TW for shot of Abilify. His speech is rapid and pressured and he is restless in the bed in 2 point restraints. He is not able to provide reliable information regarding psychiatric diagnoses.     Review of the chart reveals that pt has had multiple psychiatric [place] multiple [place], [place] [facility] able [place] "home state". [place] but after diagnosis of schizophrenia at age 52yo the patient has not lived at home. Pt's siblings do not want to be part of care or disclose where mother is to the patient.         Psychiatric Review of Systems (positives bolded)  '[x]'$  Unable to assess due to psychiatric condition  Safety:  Access for firearm, aggression, self-injurious behaviors,  suicidal thoughts/plans/intent, homicidal thoughts/plans/intent.  Depression: Depressed mood OR anhedonia x > 2 weeks PLUS insomnia/hypersomnia, worthlessness/excessive guilt, decreased energy, decrease in concentration, changes in appetite (increased/decreased), psychomotor agitation/retardation, suicidal ideation   Mania: Abnormally or persistently elevated/irritable/expansive mood x > 4 days, x > 7 days PLUS grandiosity, decreased need for sleep, more talkative/pressured speech, flight of ideas, distractibility, increase in goal directed activity, engaging in high risk activity  Anxiety: Excessive anxiety and worry x > 6 months PLUS restless or on edge, easily fatigues, difficulty concentrating, irritability, muscle tension or sleep disturbance  Psychosis: Auditory/visual/tactile hallucinations, ideas of reference, delusions  PTSD: Trauma, (sexual ,physical, emotional), flashbacks, nightmares, avoidant behavior, dissociation    Past Psychiatric History:  Previous diagnoses:  Yes schizoaffective, bipolar disorder and substance abuse of cocaine/ methamphetamine  Previous medication trials: Yes currently taking abilify LAI monthly  Treatment and medication compliance:  yes  Previous psychiatric hospitalizations: Yes multiple  Previous suicide attempts: unknown  Currently in treatment with pt was with [facility], not currently affiliated with them.   Case management services (name and contact number): None    History of Violence/Aggression:   Violence toward self or others:  unknown aggression history, pt not reliable source of information in current psychiatric state    Current Medications  List ALL medications with last know date taken:  Abilify injection  monthly per mother and patient - presumed to be [facility]    Substance Use History (pattern of use and last use)  '[x]'$  Unable to assess due to psychiatric condition/mental status change   '[]'$  Denies use in past 12 months  '[]'$  Endorses use in the past  12 months: (check all that apply, document how much, how often and last use)  '[]'$  Alcohol:   '[]'$  Marijuana:   '[]'$  Opioids:   '[]'$  Sedatives:   '[x]'$  Stimulants: history of stimulant use on chart review  '[]'$  Hallucinogens:   '[]'$  Other:       Social History:   Currently employed?: No  Family support?: No  Domiciled?:No  Legal History:Yes, pt was incarcerated prior to and after recent hospital admission in fall of 2023  - unclear from Mother what the reason was, she believes theft for money    Contact Information (family, surrogate, DPOA, caretaker, healthcare providers):   No emergency contact information on file.    Past Medical History:  History reviewed. No pertinent past medical history.    Past Surgical History:  History reviewed. No pertinent surgical history.    Family History:  History reviewed. No pertinent family history.     Allergies:  No Known Allergies    Current Evaluation:  Visit Vitals  BP (!) 154/92   Pulse 76   Temp 97.2 F (36.2 C) (Temporal)   Resp 17   Ht 1.88 m ('6\' 2"'$ )   Wt 80.7 kg (178 lb)   SpO2 100%   BMI 22.85 kg/m          Mental Status Evaluation:  Appearance:  disheveled, older than stated age, and undressed from waist down   Behavior:  psychomotor agitation and restless and fidgety   Speech:  loud, pressured, and profane   Mood:  angry, anxious, and irritable   Affect:  mood-congruent   Thought Process:  flight of ideas, loose associations, and tangential   Thought Content:  UTA   Sensorium:  person   Cognition:  impaired due to psychiatric disease   Insight:  none   Judgment:  none       Current Laboratories/Other Tests:  Labs: reviewed BW, normal CBC, cmp              UDS - negative   BAL negative              Covid negative  Results       Procedure Component Value Units Date/Time    Comprehensive metabolic panel [id number]  (Abnormal) Collected: 04/04/22 0154    Specimen: Blood Updated: 04/04/22 0235     Glucose 110 mg/dL      BUN 15.0 mg/dL      Creatinine 0.9 mg/dL      Sodium 139 mEq/L       Potassium 3.4 mEq/L  Chloride 109 mEq/L      CO2 24 mEq/L      Calcium 9.0 mg/dL      Protein, Total 6.6 g/dL      Albumin 3.5 g/dL      AST (SGOT) 24 U/L      ALT 20 U/L      Alkaline Phosphatase 72 U/L      Bilirubin, Total 0.4 mg/dL      Globulin 3.1 g/dL      Albumin/Globulin Ratio 1.1     Anion Gap 6.0     eGFR >60.0 mL/min/1.73 m2     COVID-19 (SARS-CoV-2) only ([facility]) - Behavioral health admission (no isolation) [id number] Collected: 04/04/22 0154    Specimen: Nasopharyngeal Updated: 04/04/22 0235     Purpose of COVID testing Screening     SARS-CoV-2 Specimen Source Nasal Swab     SARS CoV 2 Overall Result Not Detected    Narrative:      o Collect and clearly label specimen type:  o PREFERRED-Upper respiratory specimen: One Nasal Swab in  Transport Media.  o Hand deliver to laboratory ASAP  Indication for testing->Behavioral health admission  Screening    Ethanol (Alcohol) Level [id number] Collected: 04/04/22 0154    Specimen: Blood Updated: 04/04/22 0234     Alcohol NONE DETECTED mg/dL     Salicylate Level [id number]  (Abnormal) Collected: 04/04/22 0154    Specimen: Blood Updated: [id number]     Salicylate Level 0000000 mg/dL     Acetaminophen Level [id number]  (Abnormal) Collected: 04/04/22 0154    Specimen: Blood Updated: 04/04/22 0234     Acetaminophen Level <3 ug/mL     CBC and differential [id number]  (Abnormal) Collected: 04/04/22 0154    Specimen: Blood Updated: 04/04/22 0216     WBC 5.39 x10 3/uL      Hgb 13.9 g/dL      Hematocrit 41.0 %      Platelets 180 x10 3/uL      RBC 4.21 x10 6/uL      MCV 97.4 fL      MCH 33.0 pg      MCHC 33.9 g/dL      RDW 12 %      MPV 10.0 fL      Instrument Absolute Neutrophil Count 2.18 x10 3/uL      Neutrophils 40.4 %      Lymphocytes Automated 48.2 %      Monocytes 8.2 %      Eosinophils Automated 2.2 %      Basophils Automated 0.6 %      Immature Granulocytes 0.4 %      Nucleated RBC 0.0 /100 WBC      Neutrophils Absolute 2.18 x10 3/uL       Lymphocytes Absolute Automated 2.60 x10 3/uL      Monocytes Absolute Automated 0.44 x10 3/uL      Eosinophils Absolute Automated 0.12 x10 3/uL      Basophils Absolute Automated 0.03 x10 3/uL      Immature Granulocytes Absolute 0.02 x10 3/uL      Absolute NRBC 0.00 x10 3/uL     Rapid drug screen, urine [id number] Collected: 04/03/22 2353    Specimen: Urine Updated: 04/04/22 0024     Urine Amphetamine Screen Negative     Barbiturate Screen, UR Negative     Benzodiazepine Screen, UR Negative     Cannabinoid Screen, UR Negative     Cocaine, UR Negative     Urine  Fentanyl Negative     Opiate Screen, UR Negative     PCP Screen, UR Negative           EKG:   EKG Results       Procedure Component Value Units Date/Time    ECG 12 lead (Electrocardiogram) [id number] Collected: 04/04/22 1208     Updated: 04/04/22 1211     Ventricular Rate 68 BPM      Atrial Rate 68 BPM      P-R Interval 154 ms      QRS Duration 80 ms      Q-T Interval 384 ms      QTC Calculation (Bezet) 408 ms      P Axis 27 degrees      R Axis 76 degrees      T Axis 79 degrees      IHS MUSE NARRATIVE AND IMPRESSION --     NORMAL SINUS RHYTHM  NORMAL ECG  WHEN COMPARED WITH ECG OF 04-Apr-2022 09:03, (UNCONFIRMED)  NO SIGNIFICANT CHANGE WAS FOUND      Narrative:      NORMAL SINUS RHYTHM  NORMAL ECG  WHEN COMPARED WITH ECG OF 04-Apr-2022 09:03, (UNCONFIRMED)  NO SIGNIFICANT CHANGE WAS FOUND           EKG - QT-c - 408 - NSR, normal ekg    Thank you for allowing [place] to participate in the care of this patient.   These findings and recommendations were discussed with the ED team caring for this patient.    This note will be forwarded to an attending psychiatrist for review.        Signed: April 04, 2022  Peter Minium, [place]

## 2022-04-04 NOTE — ED Notes (Signed)
TW was informed by Jordan Hawks that patient is TDO and no current update on patient bed placement.

## 2022-04-04 NOTE — ED Notes (Signed)
Patient yelling from room stating "I shit and pissed myself". Patient given change of clothes. Patient yelling with flight of ideas/word salad. Attempting to verbally de-escalate. Patient yelling "shut the fuck up" and pacing around the room. Patient placed back into bed. MD Kopack made aware. See MAR.

## 2022-04-04 NOTE — ED Notes (Signed)
Patient continues to refused EKG and blood work

## 2022-04-04 NOTE — ED Notes (Signed)
Patient continues to refuse repeat vital signs and EKG

## 2022-04-04 NOTE — ED Notes (Signed)
TW presented the pt to Dr.Suresh, and pt was declined due to level of acuity and aggression.     TW informed the [facility] of pt's declined status, pt will continue to be a bed search through the CSB. If there are any questions about placement, the CSB can be reached at [phone]    TW informed ED Nurse Loma Sousa of pt's declined status

## 2022-04-04 NOTE — ED Notes (Signed)
TW uploaded patient's prescreen

## 2022-04-04 NOTE — ED Notes (Signed)
TW spoke with Ubaldo Glassing at [facility] and was informed no current update on patient bed placement. TW faxed patient's individual Covid results and clinic to CSB

## 2022-04-04 NOTE — ED Notes (Signed)
This RN assisted patient in changing his pants, provided wipes to clean body. Sheet covered in urine/feces, replaced sheet at this time. Provided breakfast tray to patient, currently calm and cooperative with RN and PD.

## 2022-04-04 NOTE — ED Notes (Signed)
Patient refusing EKG. Kopack MD made aware
